# Patient Record
Sex: Female | Born: 1970 | Race: White | Hispanic: No | Marital: Married | Smoking: Never smoker
Health system: Southern US, Community
[De-identification: ages and names within clinical notes are randomized; demographics above are authoritative.]

## PROBLEM LIST (undated history)

## (undated) DIAGNOSIS — M5432 Sciatica, left side: Secondary | ICD-10-CM

## (undated) DIAGNOSIS — R002 Palpitations: Secondary | ICD-10-CM

## (undated) HISTORY — DX: Sciatica, left side: M54.32

## (undated) HISTORY — DX: Palpitations: R00.2

---

## 2014-02-17 DIAGNOSIS — R252 Cramp and spasm: Secondary | ICD-10-CM | POA: Insufficient documentation

## 2015-12-14 DIAGNOSIS — R490 Dysphonia: Secondary | ICD-10-CM | POA: Insufficient documentation

## 2015-12-14 DIAGNOSIS — K219 Gastro-esophageal reflux disease without esophagitis: Secondary | ICD-10-CM | POA: Insufficient documentation

## 2015-12-14 DIAGNOSIS — R05 Cough: Secondary | ICD-10-CM | POA: Insufficient documentation

## 2015-12-14 DIAGNOSIS — J3089 Other allergic rhinitis: Secondary | ICD-10-CM | POA: Insufficient documentation

## 2015-12-14 DIAGNOSIS — R053 Chronic cough: Secondary | ICD-10-CM | POA: Insufficient documentation

## 2017-02-07 ENCOUNTER — Other Ambulatory Visit: Payer: Self-pay | Admitting: Family Medicine

## 2017-02-07 ENCOUNTER — Ambulatory Visit (HOSPITAL_BASED_OUTPATIENT_CLINIC_OR_DEPARTMENT_OTHER)
Admission: RE | Admit: 2017-02-07 | Discharge: 2017-02-07 | Disposition: A | Discharge status: Home / Self Care | Payer: Managed Care, Other (non HMO) | Source: Ambulatory Visit | Attending: Family Medicine | Admitting: Family Medicine

## 2017-02-07 DIAGNOSIS — M1288 Other specific arthropathies, not elsewhere classified, other specified site: Secondary | ICD-10-CM | POA: Diagnosis not present

## 2017-02-07 DIAGNOSIS — M545 Low back pain, unspecified: Secondary | ICD-10-CM

## 2017-10-24 ENCOUNTER — Ambulatory Visit (INDEPENDENT_AMBULATORY_CARE_PROVIDER_SITE_OTHER)
Admission: RE | Admit: 2017-10-24 | Discharge: 2017-10-24 | Disposition: A | Discharge status: Home / Self Care | Payer: Managed Care, Other (non HMO) | Source: Ambulatory Visit | Attending: Pulmonary Disease | Admitting: Pulmonary Disease

## 2017-10-24 ENCOUNTER — Encounter: Payer: Self-pay | Admitting: Pulmonary Disease

## 2017-10-24 ENCOUNTER — Ambulatory Visit (INDEPENDENT_AMBULATORY_CARE_PROVIDER_SITE_OTHER): Payer: Managed Care, Other (non HMO) | Admitting: Pulmonary Disease

## 2017-10-24 VITALS — BP 126/80 | HR 87 | Ht 66.0 in | Wt 194.0 lb

## 2017-10-24 DIAGNOSIS — R05 Cough: Secondary | ICD-10-CM

## 2017-10-24 DIAGNOSIS — K219 Gastro-esophageal reflux disease without esophagitis: Secondary | ICD-10-CM

## 2017-10-24 DIAGNOSIS — J45991 Cough variant asthma: Secondary | ICD-10-CM | POA: Diagnosis not present

## 2017-10-24 DIAGNOSIS — R053 Chronic cough: Secondary | ICD-10-CM

## 2017-10-24 NOTE — Assessment & Plan Note (Signed)
She has completed course of low-dose Protonix without significant relief.  EGD is planned

## 2017-10-24 NOTE — Patient Instructions (Addendum)
Spirometry shows normal lung function Chest x-ray today  We will treat you for postnasal drip as a trigger for your cough  Trial of chlorpheniramine 8 mg at bedtime x 3 weeks Store brand Sudafed such as phenylephrine 10 mg daytime x 3 weeks  Sample of Symbicort 80 2 puffs twice daily, rinse mouth after use

## 2017-10-24 NOTE — Assessment & Plan Note (Signed)
Spirometry shows normal lung function Chest x-ray today  We will treat sequentially for postnasal drip as a trigger   Trial of chlorpheniramine 8 mg at bedtime x 3 weeks Store brand Sudafed such as phenylephrine 10 mg daytime x 3 weeks   If no better, trial of  Sample of Symbicort 80 2 puffs twice daily, rinse mouth after use

## 2017-10-24 NOTE — Progress Notes (Signed)
Subjective:    Patient ID: Sandra Velasquez, female    DOB: 08-Jun-1970, 47 y.o.   MRN: [id number]  HPI  Chief Complaint  Patient presents with  . Pulm Consult    Self-Referral for chronic, productive cough for the past few years.    47 year old never smoker presents for evaluation of chronic cough and wheezing.  She reports ongoing symptoms for longer than 5 years.  Symptoms are worse with laughing and with exercise and seems to be relieved by resting.  She has tried Zyrtec, Allegra over-the-counter cough syrup and Singulair with minimal relief.  She reports onset of symptoms around 2011 when she first underwent ENT evaluation in [facility], upper endoscopy showed nodules in her throat.  She then migrated to the [place] for 3 years and symptoms improved.  She moved to [place] around 2015 and she developed constant throat clearing and coughing and wheezing.  She was evaluated by 3 ENT physicians at different times, Dr. Jearld Fenton told her that she has chronic sinusitis and advised FESS procedure.  Another ENT physician advised an allergy test which was unrevealing.  Dr. Pollyann Kennedy told her that symptoms could be related to GERD.  She was given low-dose Protonix by her PCP without significant relief.  She has seen a gastroenterologist and EGD spine on 10/30. She also developed low-grade fevers and underwent evaluation by PCP, she was found to have low B12 and vitamin D levels and iron levels which were repleted.  She reports a history of exercise-induced asthma when in high school which she grew out off and never needed an inhaler on a consistent basis. She denies heartburn or frequent chest colds.  She reports occasional nasal congestion and drainage  Significant tests/ events reviewed  Spirometry today shows no evidence of airway obstruction with FEV1 96%. CT sinuses 12/2015 mild mucosal thickening of the right sphenoid no other active sinusitis   She is a homemaker, lives with her husband and  sons and has a goldendoodle at home for the last 5 years.  No past medical history on file.   Past surgical history-tubal ligation and C-section x3  Social History   Socioeconomic History  . Marital status: Married    Spouse name: Not on file  . Number of children: Not on file  . Years of education: Not on file  . Highest education level: Not on file  Occupational History  . Not on file  Social Needs  . Financial resource strain: Not on file  . Food insecurity:    Worry: Not on file    Inability: Not on file  . Transportation needs:    Medical: Not on file    Non-medical: Not on file  Tobacco Use  . Smoking status: Never Smoker  . Smokeless tobacco: Never Used  Substance and Sexual Activity  . Alcohol use: Not on file  . Drug use: Not on file  . Sexual activity: Not on file  Lifestyle  . Physical activity:    Days per week: Not on file    Minutes per session: Not on file  . Stress: Not on file  Relationships  . Social connections:    Talks on phone: Not on file    Gets together: Not on file    Attends religious service: Not on file    Active member of club or organization: Not on file    Attends meetings of clubs or organizations: Not on file    Relationship status: Not on file  .  Intimate partner violence:    Fear of current or ex partner: Not on file    Emotionally abused: Not on file    Physically abused: Not on file    Forced sexual activity: Not on file  Other Topics Concern  . Not on file  Social History Narrative  . Not on file    No family history on file. Father had lymphoma and rheumatoid arthritis and heart disease  Review of Systems  Constitutional: Negative for fever and unexpected weight change.  HENT: Positive for congestion. Negative for dental problem, ear pain, nosebleeds, postnasal drip, rhinorrhea, sinus pressure, sneezing, sore throat and trouble swallowing.   Eyes: Negative for redness and itching.  Respiratory: Positive for cough  and shortness of breath. Negative for chest tightness and wheezing.   Cardiovascular: Negative for palpitations and leg swelling.  Gastrointestinal: Negative for nausea and vomiting.  Genitourinary: Negative for dysuria.  Musculoskeletal: Negative for joint swelling.  Skin: Negative for rash.  Allergic/Immunologic: Negative.  Negative for environmental allergies, food allergies and immunocompromised state.  Neurological: Negative for headaches.  Hematological: Does not bruise/bleed easily.  Psychiatric/Behavioral: Negative for dysphoric mood. The patient is not nervous/anxious.        Objective:   Physical Exam  Gen. Pleasant,  in no distress, normal affect ENT - no lesions, no post nasal drip, class 2 airway, no pallor Neck: No JVD, no thyromegaly, no carotid bruits Lungs: no use of accessory muscles, no dullness to percussion, decreased without rales or rhonchi , wheezing after a bout of coughing Cardiovascular: Rhythm regular, heart sounds  normal, no murmurs or gallops, no peripheral edema Abdomen: soft and non-tender, no hepatosplenomegaly, BS normal. Musculoskeletal: No deformities, no cyanosis or clubbing Neuro:  alert, non focal, no tremors       Assessment & Plan:

## 2017-12-09 ENCOUNTER — Ambulatory Visit: Payer: Managed Care, Other (non HMO) | Admitting: Pulmonary Disease

## 2018-06-02 ENCOUNTER — Other Ambulatory Visit: Payer: Self-pay

## 2018-06-02 ENCOUNTER — Ambulatory Visit (INDEPENDENT_AMBULATORY_CARE_PROVIDER_SITE_OTHER): Payer: Managed Care, Other (non HMO) | Admitting: Allergy and Immunology

## 2018-06-02 ENCOUNTER — Encounter: Payer: Self-pay | Admitting: Allergy and Immunology

## 2018-06-02 VITALS — BP 120/70 | HR 75 | Temp 98.5°F | Resp 16 | Ht 65.0 in | Wt 184.6 lb

## 2018-06-02 DIAGNOSIS — J3089 Other allergic rhinitis: Secondary | ICD-10-CM | POA: Diagnosis not present

## 2018-06-02 DIAGNOSIS — J301 Allergic rhinitis due to pollen: Secondary | ICD-10-CM | POA: Diagnosis not present

## 2018-06-02 DIAGNOSIS — G43909 Migraine, unspecified, not intractable, without status migrainosus: Secondary | ICD-10-CM | POA: Diagnosis not present

## 2018-06-02 DIAGNOSIS — K219 Gastro-esophageal reflux disease without esophagitis: Secondary | ICD-10-CM

## 2018-06-02 DIAGNOSIS — L710 Perioral dermatitis: Secondary | ICD-10-CM

## 2018-06-02 MED ORDER — AZELASTINE HCL 0.1 % NA SOLN: 2.0000 | Freq: Two times a day (BID) | NASAL | 5 refills | Status: DC

## 2018-06-02 MED ORDER — OMEPRAZOLE 40 MG PO CPDR: 40.0000 mg | DELAYED_RELEASE_CAPSULE | Freq: Every day | ORAL | 5 refills | Status: DC

## 2018-06-02 MED ORDER — CYPROHEPTADINE HCL 4 MG PO TABS: 4.0000 mg | ORAL_TABLET | Freq: Three times a day (TID) | ORAL | 0 refills | Status: DC | PRN

## 2018-06-02 MED ORDER — FAMOTIDINE 40 MG PO TABS: 40.0000 mg | ORAL_TABLET | Freq: Every day | ORAL | 5 refills | Status: DC

## 2018-06-02 MED ORDER — METRONIDAZOLE 1 % EX GEL: Freq: Every day | CUTANEOUS | 0 refills | Status: DC

## 2018-06-02 NOTE — Patient Instructions (Addendum)
  1.  Allergen avoidance measures  2.  Treat and prevent inflammation:   A.  OTC Rhinocort-1 spray each nostril daily  B.  Metronidazole 1% cream- apply to face daily  3.  Treat and prevent LPR:   A.  Consolidate caffeine use to AM  B.  Omeprazole 40 mg -1 tablet in a.m.  C.  Famotidine 40 mg - 1 tablet in p.m.  4.  Treat and prevent headaches:   A.  Consolidate caffeine use to AM  B.  Periactin 4 mg -1/2 tablet at bedtime  5.  If needed:   A. Azelastine -1-2 sprays each nostril 1-2 times a day  B.  OTC Pataday -1 drop each eye 1 time per day  6.  Immunotherapy?  7.  Return to clinic in 4 weeks or earlier if problem

## 2018-06-02 NOTE — Progress Notes (Signed)
[place] [facility] [place] -    Dear Dr. Conley Rolls,  Thank you for referring Sandra Velasquez to the [facility] on 06/02/2018.   Below is a summation of this patient's evaluation and recommendations.  Thank you for your referral. I will keep you informed about this patient's response to treatment.   If you have any questions please do not hesitate to contact me.   Sincerely,  Jessica Priest, MD Allergy / Immunology [facility]   ______________________________________________________________________    NEW PATIENT NOTE  Referring Provider: Lenell Antu, DO Primary Provider: Lenell Antu, DO Date of office visit: 06/02/2018    Subjective:   Chief Complaint:  Sandra Velasquez (DOB: Jun 09, 1970) is a 48 y.o. female who presents to the clinic on 06/02/2018 with a chief complaint of Allergic Rhinitis  and Urticaria (fever blister ) .     HPI: Sandra Velasquez presents to this clinic in evaluation of multiple issues.  First, she has a history of rhinitis with nasal congestion and sneezing and stuffiness and some itchy watery eyes that appears to occur on a perennial basis and flares when being exposed to pollen during the seasons of the year.  This has been a much more active issue since she moved from [place] in 2015 to the [place] area.  She uses a nasal steroid and Singulair and antihistamines but she is not very impressed that these medications really result in good control of her issue.  Antihistamines just make her lips too dry.  Second, because of her nasal issues she has had evaluation with ENT and apparently was told that she had chronic sinusitis based upon a CT scan and it was recommended that she undergo surgery.  However, she sought out a second opinion in 2018 with ENT in [place] who performed a repeat CT scan which apparently did not identify a significant amount  of sinusitis and she was skin tested at that point in time and found to be allergic to various aeroallergens.  Third, she has constant throat clearing and postnasal drip and cannot clear out her throat and has coughing.  Her husband states that she is driving him crazy with all of her coughing.  She apparently was evaluated for reflux with an upper endoscopy which did not identify any reflux.  She did drink lots of [employer] currently at 2 cans of [employer] per day.  In 2019 she empirically treated her reflux by discontinuing her [facility] for 4 months which did not really help this issue.  She may been given a proton pump inhibitor which did not help this issue.  Fourth, she has a history of "recurrent fever.  She has a tympanic thermometer which she measures whenever she develops a headache.  She states that her temperature ranges from about 99-99.8 when she is having a headache.  Apparently she has seen a rheumatologist and a hematologist regarding this issue back in 2018 of which the diagnostic evaluation was inconclusive for an obvious disease giving rise to this issue.  Fifth, she has headaches.  She has headaches located in her frontal region that are pounding and occur about every other day for which she must take Advil and lay down for about an hour or so.  There is no associated scotoma or dizziness or other neurological symptoms.  There is no obvious trigger giving rise to this issue.  She  believes that her sleep is pretty good.  If she is not interrupted at nighttime she sleeps throughout the night.  Sometimes her dog wakes her up at nighttime.  Sixth, she has "itchy bumps" that come up on her face usually affecting her cheeks and jawline that are intensely itchy and she will excoriate these and then they develop a little bit of a scar.  This is been a common issue for the past 3 years.  She has tried triamcinolone which has not helped her.  Antihistamines has not helped her.  She now  covers these up with make-up.  History reviewed. No pertinent past medical history.  Past Surgical History:  Procedure Laterality Date  . CESAREAN SECTION     2001, 2003, 2007    Allergies as of 06/02/2018      Reactions   Clarithromycin Other (See Comments)   Cyproheptadine    Other reaction(s): Other   Amoxicillin-pot Clavulanate Other (See Comments)   Other   Erythromycin    Abdominal pain   Penicillin G Other (See Comments)   Other      Medication List    Calcium 1200+D3 600-40-500 MG-MG-UNIT Tb24 Generic drug:  Calcium-Magnesium-Vitamin D Take 1 tablet by mouth daily.   Cryselle-28 0.3-30 MG-MCG tablet Generic drug:  norgestrel-ethinyl estradiol Take 1 tablet by mouth daily.   IRON-FOLIC ACID PO Take 65 mg by mouth.   vitamin B-12 500 MCG tablet Commonly known as:  CYANOCOBALAMIN Take 2,500 mcg by mouth daily.       Review of systems negative except as noted in HPI / PMHx or noted below:  Review of Systems  Constitutional: Negative.   HENT: Negative.   Eyes: Negative.   Respiratory: Negative.   Cardiovascular: Negative.   Gastrointestinal: Negative.   Genitourinary: Negative.   Musculoskeletal: Negative.   Skin: Negative.   Neurological: Negative.   Endo/Heme/Allergies: Negative.   Psychiatric/Behavioral: Negative.     History reviewed. No pertinent family history.  Social History   Socioeconomic History  . Marital status: Married    Spouse name: Not on file  . Number of children: Not on file  . Years of education: Not on file  . Highest education level: Not on file  Occupational History  . Not on file  Social Needs  . Financial resource strain: Not on file  . Food insecurity:    Worry: Not on file    Inability: Not on file  . Transportation needs:    Medical: Not on file    Non-medical: Not on file  Tobacco Use  . Smoking status: Never Smoker  . Smokeless tobacco: Never Used  Substance and Sexual Activity  . Alcohol use: Never     Frequency: Never  . Drug use: Never  . Sexual activity: Never  Lifestyle  . Physical activity:    Days per week: Not on file    Minutes per session: Not on file  . Stress: Not on file  Relationships  . Social connections:    Talks on phone: Not on file    Gets together: Not on file    Attends religious service: Not on file    Active member of club or organization: Not on file    Attends meetings of clubs or organizations: Not on file    Relationship status: Not on file  . Intimate partner violence:    Fear of current or ex partner: Not on file    Emotionally abused: Not on file  Physically abused: Not on file    Forced sexual activity: Not on file  Other Topics Concern  . Not on file  Social History Narrative  . Not on file    Environmental and Social history  Lives in a house with a dry environment, a dog located inside the household, carpet in the bedroom, plastic on the bed, plastic on the pillow, no smoking ongoing with inside the household.  Objective:   Vitals:   06/02/18 0920  BP: 120/70  Pulse: 75  Resp: 16  Temp: 98.5 F (36.9 C)  SpO2: 97%   Height:  (165.1 cm) Weight: 184 lb 9.6 oz (83.7 kg)  Physical Exam Constitutional:      Appearance: She is not diaphoretic.  HENT:     Head: Normocephalic. No right periorbital erythema or left periorbital erythema.     Right Ear: Tympanic membrane, ear canal and external ear normal.     Left Ear: Tympanic membrane, ear canal and external ear normal.     Nose: Nose normal. No mucosal edema or rhinorrhea.     Mouth/Throat:     Pharynx: Uvula midline. No oropharyngeal exudate.  Eyes:     General: Lids are normal.     Conjunctiva/sclera: Conjunctivae normal.     Pupils: Pupils are equal, round, and reactive to light.  Neck:     Thyroid: No thyromegaly.     Trachea: Trachea normal. No tracheal tenderness or tracheal deviation.  Cardiovascular:     Rate and Rhythm: Normal rate and regular rhythm.      Heart sounds: Normal heart sounds, S1 normal and S2 normal. No murmur.  Pulmonary:     Effort: Pulmonary effort is normal. No respiratory distress.     Breath sounds: Normal breath sounds. No stridor. No wheezing or rales.  Chest:     Chest wall: No tenderness.  Abdominal:     General: There is no distension.     Palpations: Abdomen is soft. There is no mass.     Tenderness: There is no abdominal tenderness. There is no guarding or rebound.  Musculoskeletal:        General: No tenderness.  Lymphadenopathy:     Head:     Right side of head: No tonsillar adenopathy.     Left side of head: No tonsillar adenopathy.     Cervical: No cervical adenopathy.  Skin:    Coloration: Skin is not pale.     Findings: Rash (Multiple punctate intermittently erythematous papules involving chin and perioral region) present. No erythema.     Nails: There is no clubbing.   Neurological:     Mental Status: She is alert.     Diagnostics: Allergy skin tests were performed.  She demonstrated hypersensitivity to house dust mite and Alternaria mold.  Spirometry was performed.  Her FEV1 was 3.29 at 112% of predicted with a FEV1 ratio of 0.81.  Results of a chest x-ray obtained 24 October 2017 identified the following:  The heart size and mediastinal contours are within normal limits. Both lungs are clear. The visualized skeletal structures are Unremarkable.  Results of a sinus CT scan obtained 28 November 2017 identified the following:  Paranasal sinuses: Frontal: Normally aerated. Patent frontal sinus drainage pathways. Ethmoid: Normally aerated. Maxillary: Mild mucosal edema base of right maxillary sinus unchanged. Mild mucosal edema and small air-fluid level in the left maxillary sinus is new Sphenoid: Improved mucosal edema in the sphenoid sinus. Right ostiomeatal unit: Mildly narrowed due to  mucosal edema Left ostiomeatal unit: Patent. Nasal passages: Patent. Mild septal deviation to the  right Anatomy: Large concha bullosa left middle turbinate unchanged. No acute bony abnormality.  Results of a sinus CT scan obtained 26 November 2015 identified the following:  Mild mucosal thickening in the right sphenoid sinus with probable retained secretions. The sphenoid sinus ostia are widely patent. Widely patent bilateral maxillary infundibula with minor mucosal thickening on the floor of the right maxillary sinus. No associated dental disease noted. Clear frontal ethmoidal recesses. Clear ethmoids. Concha bullosa on the left causing relative left nasal cavity stenosis. Essentially midline nasal septum.  Results of blood tests obtained 01 May 2016 identified ferritin 7 NG/mL, hemoglobin 11.9, WBC 5.7 absolute lymphocyte 2100, platelet 352,, EBV VCA IgG 50 6.2U/mL, RA screen negative, ANA negative, CMV IgG negative, HIV antigen/antibody combo negative  Assessment and Plan:    1. Perennial allergic rhinitis   2. Seasonal allergic rhinitis due to pollen   3. Migraine syndrome   4. LPRD (laryngopharyngeal reflux disease)   5. Perioral dermatitis     1.  Allergen avoidance measures  2.  Treat and prevent inflammation:   A.  OTC Rhinocort-1 spray each nostril daily  B.  Metronidazole 1% cream- apply to face daily  3.  Treat and prevent LPR:   A.  Consolidate caffeine use to AM  B.  Omeprazole 40 mg -1 tablet in a.m.  C.  Famotidine 40 mg - 1 tablet in p.m.  4.  Treat and prevent headaches:   A.  Consolidate caffeine use to AM  B.  Periactin 4 mg -1/2 tablet at bedtime  5.  If needed:   A. Azelastine -1-2 sprays each nostril 1-2 times a day  B.  OTC Pataday -1 drop each eye 1 time per day  6.  Immunotherapy?  7.  Return to clinic in 4 weeks or earlier if problem  Cardosa does appear to have atopic disease and we will get her to perform allergen avoidance measures and use anti-inflammatory agents for her airway as noted above.  She also appears to have a  history very consistent with LPR and she will utilize an aggressive plan directed against this problem as noted above.  She also appears to have headaches every other day which is probably tied up with her caffeine use and a form of migraine and we will have her consolidate her caffeine and start Periactin at bedtime.  She also appears to have a perioral dermatitis which we will treat with topical metronidazole.  I will regroup with her in 4 weeks or earlier to assess her response and consider further evaluation and treatment based upon this response.  Jessica Priest, MD Allergy / Immunology [facility] [place]

## 2018-06-03 ENCOUNTER — Encounter: Payer: Self-pay | Admitting: Allergy and Immunology

## 2018-07-07 ENCOUNTER — Ambulatory Visit (INDEPENDENT_AMBULATORY_CARE_PROVIDER_SITE_OTHER): Payer: Managed Care, Other (non HMO) | Admitting: Allergy and Immunology

## 2018-07-07 ENCOUNTER — Other Ambulatory Visit: Payer: Self-pay

## 2018-07-07 VITALS — BP 100/72 | HR 80 | Resp 18

## 2018-07-07 DIAGNOSIS — G43909 Migraine, unspecified, not intractable, without status migrainosus: Secondary | ICD-10-CM | POA: Diagnosis not present

## 2018-07-07 DIAGNOSIS — J3089 Other allergic rhinitis: Secondary | ICD-10-CM

## 2018-07-07 DIAGNOSIS — L710 Perioral dermatitis: Secondary | ICD-10-CM

## 2018-07-07 DIAGNOSIS — K219 Gastro-esophageal reflux disease without esophagitis: Secondary | ICD-10-CM | POA: Diagnosis not present

## 2018-07-07 NOTE — Progress Notes (Signed)
[place]   Follow-up Note  Referring Provider: Lenell AntuLe, Thao P, DO Primary Provider: Jerral RalphMyers, Stephanie J, MD Date of Office Visit: 07/07/2018  Subjective:   Sandra Velasquez (DOB: [birth year]) is a 48 y.o. female who [facility] in re-evaluation of the following:  HPI: Sandra Velasquez returns to this clinic in reevaluation of allergic rhinoconjunctivitis, suspected LPR, chronic headache, and perioral dermatitis.  These issues were addressed during her initial evaluation of 02 Jun 2018.  She has performed allergen avoidance measures directed against house dust mite and she is better regarding her nasal congestion and sneezing and stuffiness.  Her eyes are still occasionally irritated and she used Pataday with good success but apparently after using Pataday for a week or so her lips became very dry.  She is not using a nasal steroid at this point time for she feels as though she does not have a need to do so.  She still continues to have throat clearing and postnasal drip.  She continues to use therapy directed against reflux but she has not really noticed a significant change.  She has consolidated her caffeine about 25% by moving most of her caffeine consumption to the morning.  Her headaches are much better since she moving her caffeine consumption to just in the morning.  She has only had 2 headaches.  She has not been using her Periactin on a consistent basis.  She still continues to have the rash around her face.  She is only using metronidazole as needed which is relatively rare.  Allergies as of 07/07/2018      Reactions   Clarithromycin Other (See Comments)   Cyproheptadine    Other reaction(s): Other   Amoxicillin-pot Clavulanate Other (See Comments)   Other   Erythromycin    Abdominal pain   Penicillin G Other (See Comments)   Other      Medication List      azelastine 0.1 % nasal spray  Commonly known as: ASTELIN Place 2 sprays into both nostrils 2 (two) times daily. Use in each nostril as directed   Calcium 1200+D3 600-40-500 MG-MG-UNIT Tb24 Generic drug: Calcium-Magnesium-Vitamin D Take 1 tablet by mouth daily.   Cryselle-28 0.3-30 MG-MCG tablet Generic drug: norgestrel-ethinyl estradiol Take 1 tablet by mouth daily.   famotidine 40 MG tablet Commonly known as: PEPCID Take 1 tablet (40 mg total) by mouth at bedtime.   IRON-FOLIC ACID PO Take 65 mg by mouth.   metroNIDAZOLE 1 % gel Commonly known as: METROGEL Apply topically daily.   omeprazole 40 MG capsule Commonly known as: PRILOSEC Take 1 capsule (40 mg total) by mouth daily.   vitamin B-12 500 MCG tablet Commonly known as: CYANOCOBALAMIN Take 2,500 mcg by mouth daily.       No past medical history on file.  Past Surgical History:  Procedure Laterality Date  . CESAREAN SECTION     2001, 2003, 2007    Review of systems negative except as noted in HPI / PMHx or noted below:  Review of Systems  Constitutional: Negative.   HENT: Negative.   Eyes: Negative.   Respiratory: Negative.   Cardiovascular: Negative.   Gastrointestinal: Negative.   Genitourinary: Negative.   Musculoskeletal: Negative.   Skin: Negative.   Neurological: Negative.   Endo/Heme/Allergies: Negative.   Psychiatric/Behavioral: Negative.      Objective:   Vitals:   07/07/18 1007  BP: 100/72  Pulse: 80  Resp: 18  SpO2: 97%          Physical Exam Constitutional:      Appearance: She is not diaphoretic.  HENT:     Head: Normocephalic.     Right Ear: Tympanic membrane, ear canal and external ear normal.     Left Ear: Tympanic membrane, ear canal and external ear normal.     Nose: Nose normal. No mucosal edema or rhinorrhea.     Mouth/Throat:     Pharynx: Uvula midline. No oropharyngeal exudate.  Eyes:     Conjunctiva/sclera: Conjunctivae normal.  Neck:     Thyroid: No thyromegaly.     Trachea: Trachea  normal. No tracheal tenderness or tracheal deviation.  Cardiovascular:     Rate and Rhythm: Normal rate and regular rhythm.     Heart sounds: Normal heart sounds, S1 normal and S2 normal. No murmur.  Pulmonary:     Effort: No respiratory distress.     Breath sounds: Normal breath sounds. No stridor. No wheezing or rales.  Lymphadenopathy:     Head:     Right side of head: No tonsillar adenopathy.     Left side of head: No tonsillar adenopathy.     Cervical: No cervical adenopathy.  Skin:    Findings: Rash (Perioral dermatitis with acneiform-like lesions affecting perioral region) present. No erythema.     Nails: There is no clubbing.   Neurological:     Mental Status: She is alert.     Diagnostics: none  Assessment and Plan:   1. Perennial allergic rhinitis   2. Migraine syndrome   3. LPRD (laryngopharyngeal reflux disease)   4. Perioral dermatitis     1.  Continue to perform allergen avoidance measures  2.  Continue to treat and prevent inflammation:   A.  OTC Rhinocort-1 spray each nostril 3-7 times per week  B.  Metronidazole 1% cream- apply to face daily  3.  Continue to treat and prevent LPR:   A.  Consolidate caffeine use to AM  B.  Discontinue omeprazole and famotidine  4.  Continue to treat and prevent headaches:   A.  Consolidate caffeine use to AM  B.  Do not restart periactin at this point  5.  If needed:   A. Azelastine -1-2 sprays each nostril 1-2 times a day  B.  OTC Pataday -1 drop each eye 1 time per day  6.  Consider starting a course of immunotherapy  7.  Return to clinic in 6 months or earlier if problem  8.  Obtain full flu vaccine  Celita is somewhat better with attention to allergen avoidance measures but she still remains with respiratory tract symptoms and she would definitely be a candidate for immunotherapy.  I have encouraged her to use some dose of nasal steroid on a regular basis and I have also encouraged her to use topical  metronidazole on a regular basis to address the inflammatory component of her skin condition.  Her headaches are much better since she has reorganized her caffeine use and I do not think there is a need to restart any Periactin at this point.  She has really noticed no improvement regarding her drainage in her throat and throat clearing and slight cough with attention to reflux therapy and were now going to have her perform the "reverse experiment" by discontinuing omeprazole and famotidine and seeing what happens to the symptoms as she moves forward.  I will see her back in this clinic in  6 months or earlier if there is a problem.  Allena Katz, MD Allergy / Immunology [place]

## 2018-07-07 NOTE — Patient Instructions (Addendum)
  1.  Continue to perform allergen avoidance measures  2.  Continue to treat and prevent inflammation:   A.  OTC Rhinocort-1 spray each nostril 3-7 times per week  B.  Metronidazole 1% cream- apply to face daily  3.  Continue to treat and prevent LPR:   A.  Consolidate caffeine use to AM  B.  Discontinue omeprazole and famotidine  4.  Continue to treat and prevent headaches:   A.  Consolidate caffeine use to AM  B.  Do not restart periactin at this point  5.  If needed:   A. Azelastine -1-2 sprays each nostril 1-2 times a day  B.  OTC Pataday -1 drop each eye 1 time per day  6.  Consider starting a course of immunotherapy  7.  Return to clinic in 6 months or earlier if problem  8.  Obtain full flu vaccine

## 2018-07-08 ENCOUNTER — Encounter: Payer: Self-pay | Admitting: Allergy and Immunology

## 2018-09-08 ENCOUNTER — Other Ambulatory Visit: Payer: Self-pay | Admitting: Obstetrics and Gynecology

## 2018-09-08 DIAGNOSIS — Z9189 Other specified personal risk factors, not elsewhere classified: Secondary | ICD-10-CM

## 2018-11-06 ENCOUNTER — Ambulatory Visit
Admission: RE | Admit: 2018-11-06 | Discharge: 2018-11-06 | Disposition: A | Discharge status: Home / Self Care | Payer: Managed Care, Other (non HMO) | Source: Ambulatory Visit | Attending: Obstetrics and Gynecology | Admitting: Obstetrics and Gynecology

## 2018-11-06 DIAGNOSIS — Z9189 Other specified personal risk factors, not elsewhere classified: Secondary | ICD-10-CM

## 2018-11-06 MED ORDER — GADOBUTROL 1 MMOL/ML IV SOLN
8.0000 mL | Freq: Once | INTRAVENOUS | Status: AC | PRN
  Administered 2018-11-06: 8 mL via INTRAVENOUS

## 2019-01-12 ENCOUNTER — Ambulatory Visit: Payer: Managed Care, Other (non HMO) | Admitting: Allergy and Immunology

## 2019-02-18 ENCOUNTER — Other Ambulatory Visit: Payer: Self-pay

## 2019-02-18 ENCOUNTER — Encounter (HOSPITAL_COMMUNITY): Payer: Self-pay

## 2019-02-18 ENCOUNTER — Emergency Department (HOSPITAL_COMMUNITY)
Admission: EM | Admit: 2019-02-18 | Discharge: 2019-02-18 | Disposition: A | Discharge status: Home / Self Care | Payer: Managed Care, Other (non HMO) | Attending: Emergency Medicine | Admitting: Emergency Medicine

## 2019-02-18 DIAGNOSIS — T7840XA Allergy, unspecified, initial encounter: Secondary | ICD-10-CM | POA: Insufficient documentation

## 2019-02-18 DIAGNOSIS — Z79899 Other long term (current) drug therapy: Secondary | ICD-10-CM | POA: Insufficient documentation

## 2019-02-18 MED ORDER — EPINEPHRINE 0.3 MG/0.3ML IJ SOAJ: 0.3000 mg | INTRAMUSCULAR | 0 refills | Status: DC | PRN

## 2019-02-18 MED ORDER — FAMOTIDINE IN NACL 20-0.9 MG/50ML-% IV SOLN
20.0000 mg | Freq: Once | INTRAVENOUS | Status: AC
  Administered 2019-02-18: 20 mg via INTRAVENOUS
  Filled 2019-02-18: qty 50

## 2019-02-18 MED ORDER — PREDNISONE 10 MG PO TABS: 40.0000 mg | ORAL_TABLET | Freq: Every day | ORAL | 0 refills | Status: AC

## 2019-02-18 NOTE — Discharge Instructions (Addendum)
If you need to use your EpiPen, call 911 afterwards. Use your EpiPen if you start to feel like your lips, tongue or swelling or you are having trouble breathing. Follow-up with your allergy specialist. Return to the ED if this happens again. Do not use the hair product or any new hair products until you are evaluated by your allergy specialist.

## 2019-02-18 NOTE — ED Provider Notes (Signed)
[facility]-EMERGENCY DEPT Provider Note   CSN: [id number] Arrival date & time: 02/18/19  1453     History Chief Complaint  Patient presents with  . Allergic Reaction    Sandra Velasquez is a 49 y.o. female with a past medical history of allergic rhinitis presenting to the ED after allergic reaction that occurred prior to arrival.  She was getting a perm at a hair salon when her hairdresser put some sort of performing serum on her hair.  She immediately started developing burning and itching around her eyes, followed by itching around her mouth, palms and ankles, feeling like her lips and tongue was swelling.  She took a Claritin and was evaluated by EMS.  EMS gave her epi, Benadryl and Solu-Medrol.  Her symptoms have since subsided and almost completely resolved.  She denies history of anaphylactic reaction in the past.  She was told that she was allergic to environmental triggers such as wheat, dust mites and certain trees.  She was evaluated by an allergy specialist several months ago and an allergy skin test was done.  She cannot recall any other trigger other than the serum used on her hair, and does not believe she is ever used the serum before.  Denies any current chest tightness, shortness of breath, tongue swelling, rash.  HPI     History reviewed. No pertinent past medical history.  Patient Active Problem List   Diagnosis Date Noted  . Chronic cough 12/14/2015  . Laryngopharyngeal reflux (LPR) 12/14/2015  . Perennial allergic rhinitis 12/14/2015  . Hoarseness 12/14/2015  . Muscle cramping 02/17/2014    Past Surgical History:  Procedure Laterality Date  . CESAREAN SECTION     2001, 2003, 2007     OB History   No obstetric history on file.     History reviewed. No pertinent family history.  Social History   Tobacco Use  . Smoking status: Never Smoker  . Smokeless tobacco: Never Used  Substance Use Topics  . Alcohol use: Never  . Drug use:  Never    Home Medications Prior to Admission medications   Medication Sig Start Date End Date Taking? Authorizing Provider  CRYSELLE-28 0.3-30 MG-MCG tablet Take 1 tablet by mouth daily. 08/25/17  Yes [provider]  Ferrous Sulfate (IRON PO) Take 1 tablet by mouth daily.   Yes [provider]  azelastine (ASTELIN) 0.1 % nasal spray Place 2 sprays into both nostrils 2 (two) times daily. Use in each nostril as directed Patient not taking: Reported on 02/18/2019 06/02/18   Kozlow, Alvira Philips, MD  cyproheptadine (PERIACTIN) 4 MG tablet Take 1 tablet (4 mg total) by mouth 3 (three) times daily as needed for allergies. Patient not taking: Reported on 02/18/2019 06/02/18   Jessica Priest, MD  EPINEPHrine 0.3 mg/0.3 mL IJ SOAJ injection Inject 0.3 mLs (0.3 mg total) into the muscle as needed for anaphylaxis. Call 911 after using your epi pen 02/18/19   Meiah Zamudio, PA-C  famotidine (PEPCID) 40 MG tablet Take 1 tablet (40 mg total) by mouth at bedtime. Patient not taking: Reported on 02/18/2019 06/02/18   Jessica Priest, MD  metroNIDAZOLE (METROGEL) 1 % gel Apply topically daily. Patient not taking: Reported on 02/18/2019 06/02/18   Jessica Priest, MD  omeprazole (PRILOSEC) 40 MG capsule Take 1 capsule (40 mg total) by mouth daily. Patient not taking: Reported on 02/18/2019 06/02/18   Jessica Priest, MD  predniSONE (DELTASONE) 10 MG tablet Take 4 tablets (  40 mg total) by mouth daily for 4 days. 02/18/19 02/22/19  Noya Santarelli, Nicanor Alcon, PA-C    Allergies    Clarithromycin, Cyproheptadine, Amoxicillin-pot clavulanate, Erythromycin, and Penicillin g  Review of Systems   Review of Systems  Constitutional: Negative for appetite change, chills and fever.  HENT: Positive for facial swelling (resolved). Negative for ear pain, rhinorrhea, sneezing and sore throat.   Eyes: Negative for photophobia and visual disturbance.  Respiratory: Positive for shortness of breath (resolved). Negative for cough, chest tightness and  wheezing.   Cardiovascular: Negative for chest pain and palpitations.  Gastrointestinal: Negative for abdominal pain, blood in stool, constipation, diarrhea, nausea and vomiting.  Genitourinary: Negative for dysuria, hematuria and urgency.  Musculoskeletal: Negative for myalgias.  Skin: Positive for rash (resolved).  Neurological: Negative for dizziness, weakness and light-headedness.    Physical Exam Updated Vital Signs BP (!) 102/59   Pulse 100   Temp 97.9 F (36.6 C) (Oral)   Resp 16   SpO2 95%   Physical Exam Vitals and nursing note reviewed.  Constitutional:      General: She is not in acute distress.    Appearance: She is well-developed.     Comments: No rash noted on exam.  No signs of angioedema or anaphylaxis.  Speaking complete sentences without difficulty.  HENT:     Head: Normocephalic and atraumatic.     Nose: Nose normal.  Eyes:     General: No scleral icterus.       Left eye: No discharge.     Conjunctiva/sclera: Conjunctivae normal.  Cardiovascular:     Rate and Rhythm: Normal rate and regular rhythm.     Heart sounds: Normal heart sounds. No murmur. No friction rub. No gallop.   Pulmonary:     Effort: Pulmonary effort is normal. No respiratory distress.     Breath sounds: Normal breath sounds.  Abdominal:     General: Bowel sounds are normal. There is no distension.     Palpations: Abdomen is soft.     Tenderness: There is no abdominal tenderness. There is no guarding.  Musculoskeletal:        General: Normal range of motion.     Cervical back: Normal range of motion and neck supple.  Skin:    General: Skin is warm and dry.     Findings: No rash.  Neurological:     Mental Status: She is alert.     Motor: No abnormal muscle tone.     Coordination: Coordination normal.     ED Results / Procedures / Treatments   Labs (all labs ordered are listed, but only abnormal results are displayed) Labs Reviewed - No data to display  EKG None  Radiology  No results found.  Procedures Procedures (including critical care time)  Medications Ordered in ED Medications  famotidine (PEPCID) IVPB 20 mg premix (0 mg Intravenous Stopped 02/18/19 1713)    ED Course  I have reviewed the triage vital signs and the nursing notes.  Pertinent labs & imaging results that were available during my care of the patient were reviewed by me and considered in my medical decision making (see chart for details).    MDM Rules/Calculators/A&P                      49 year old female with environmental allergies and allergic rhinitis presenting to the ED after anaphylactic reaction to a hair serum while at a hair salon.  She was given epinephrine, Benadryl and  Solu-Medrol in addition to the Claritin that she took after her reaction started.  Her symptoms have since resolved and she states that she is back to baseline.  Will give Pepcid and continue to monitor for any rebound symptoms.  5:54 PM Patient without recurrence of her angioedema or allergy symptoms.  She was able to get in touch with her hairdresser and will speak to her allergy specialist regarding ingredients of the hair serum.  In the meantime I have told her to refrain from any type of hair serum until she is evaluated by her allergy specialist.  We will give her a prescription for EpiPen, have her take a steroid burst and return for worsening symptoms.  She is comfortable with discharge home.  Patient is hemodynamically stable, in NAD, and able to ambulate in the ED. Evaluation does not show pathology that would require ongoing emergent intervention or inpatient treatment. I explained the diagnosis to the patient. Pain has been managed and has no complaints prior to discharge. Patient is comfortable with above plan and is stable for discharge at this time. All questions were answered prior to disposition. Strict return precautions for returning to the ED were discussed. Encouraged follow up with PCP.   An  After Visit Summary was printed and given to the patient.   Portions of this note were generated with Scientist, clinical (histocompatibility and immunogenetics). Dictation errors may occur despite best attempts at proofreading.  Final Clinical Impression(s) / ED Diagnoses Final diagnoses:  Allergic reaction, initial encounter    Rx / DC Orders ED Discharge Orders         Ordered    EPINEPHrine 0.3 mg/0.3 mL IJ SOAJ injection  As needed     02/18/19 1713    predniSONE (DELTASONE) 10 MG tablet  Daily     02/18/19 1713           Dietrich Pates, PA-C 02/18/19 1754    Charlynne Pander, MD 02/19/19 1504

## 2019-02-18 NOTE — ED Triage Notes (Signed)
EMS reports Pt coming from salon getting hair done, applied a hair product and Pt began to experience eyes burning and itching, then hand and feet itching and lips and tongue began to swell. Pt took a Claritin on site. Pt arrived and states swelling subsided, symptoms resolving, still has minor generalized itching.  BP 90/60  HR 92 RR 22 Sp02 99 RA CBG 105  20 LAC  50mg  Benedryl IV 0.3 Epi IM 125 Solumedrol IV Enroute.

## 2019-02-22 ENCOUNTER — Telehealth: Payer: Self-pay | Admitting: Allergy and Immunology

## 2019-02-22 NOTE — Telephone Encounter (Signed)
Patient went to the hair salon to get a permanent this past Thursday, 02/18/19. She said when the lady put the solution on her head, she broke out in a hot rash all over her body, her lips, eyes, tongue and throat all became swollen. The ambulance had to be called. Patient states she almost passed out. She said she had a reaction from the solution. She was able to have pictures sent to her of the box and all the ingredients. She has an appointment with Dr. Lucie Leather on 03/16/19. She wants to know if she needs to be seen sooner, or wait until 03/16/19 and bring in the pictures of the box of solution.

## 2019-02-22 NOTE — Telephone Encounter (Signed)
Please inform patient that as long as she is doing okay we can keep the March appointment and discuss this issue at that point.

## 2019-02-22 NOTE — Telephone Encounter (Signed)
Dr Kozlow please advise 

## 2019-02-22 NOTE — Telephone Encounter (Signed)
Called and advised to patient. Patient wanted to know if she wanted to know if she should e-mail pictures of the ingredients and reaction. Sent a link for patient to set up MyChart so that she can send information that way. Patient verbalized understanding.

## 2019-03-16 ENCOUNTER — Ambulatory Visit: Payer: Managed Care, Other (non HMO) | Admitting: Allergy and Immunology

## 2019-04-20 ENCOUNTER — Encounter: Payer: Self-pay | Admitting: Allergy and Immunology

## 2019-05-04 ENCOUNTER — Encounter: Payer: Self-pay | Admitting: Allergy and Immunology

## 2019-05-11 ENCOUNTER — Other Ambulatory Visit: Payer: Self-pay

## 2019-05-11 ENCOUNTER — Ambulatory Visit (INDEPENDENT_AMBULATORY_CARE_PROVIDER_SITE_OTHER): Payer: Managed Care, Other (non HMO) | Admitting: Allergy and Immunology

## 2019-05-11 VITALS — Temp 97.6°F

## 2019-05-11 DIAGNOSIS — K219 Gastro-esophageal reflux disease without esophagitis: Secondary | ICD-10-CM

## 2019-05-11 DIAGNOSIS — L710 Perioral dermatitis: Secondary | ICD-10-CM

## 2019-05-11 DIAGNOSIS — G43909 Migraine, unspecified, not intractable, without status migrainosus: Secondary | ICD-10-CM

## 2019-05-11 DIAGNOSIS — T782XXA Anaphylactic shock, unspecified, initial encounter: Secondary | ICD-10-CM

## 2019-05-11 DIAGNOSIS — J3089 Other allergic rhinitis: Secondary | ICD-10-CM

## 2019-05-11 MED ORDER — OLOPATADINE HCL 0.2 % OP SOLN: 1.0000 [drp] | Freq: Every day | OPHTHALMIC | 5 refills | Status: DC | PRN

## 2019-05-11 MED ORDER — DEXILANT 60 MG PO CPDR: 60.0000 mg | DELAYED_RELEASE_CAPSULE | Freq: Every day | ORAL | 5 refills | Status: DC

## 2019-05-11 MED ORDER — EPINEPHRINE 0.3 MG/0.3ML IJ SOAJ: 0.3000 mg | Freq: Once | INTRAMUSCULAR | 1 refills | Status: AC

## 2019-05-11 MED ORDER — METRONIDAZOLE 1 % EX GEL: Freq: Every day | CUTANEOUS | 0 refills | Status: DC

## 2019-05-11 NOTE — Progress Notes (Signed)
[place] - [place] - [place] - [place] - [place]   Follow-up Note  Referring Provider: Jerral Ralph, MD Primary Provider: Jerral Ralph, MD Date of Office Visit: 05/11/2019  Subjective:   Sandra Velasquez (DOB: 22-Jun-1970) is a 49 y.o. female who [facility] in re-evaluation of the following:  HPI: Fischetti returns to this clinic in reevaluation of allergic rhinoconjunctivitis, suspected LPR, chronic headache syndrome, perioral dermatitis, and a recent allergic reaction.  I have not seen her in this clinic since 10 July 2018.  Her acute allergic reaction occurred in February 2020.  She was having a body wave performed on her hair and within 15 minutes developed itchy eyes and then eye swelling and then global flushing and tongue swelling and throat swelling requiring transportation to the emergency room where she was injected with epinephrine.  Her reaction lasted approximately 1 hour.  She continues to have lots of throat clearing and postnasal drip.  She has not really noticed much change from this issue from when we saw her in the past.  It should be noted that she is not using any therapy for reflux as she went to see a gastroenterologist who told her she does not have reflux.  She has been having issues with her eyes. This spring she has been having itchy eyes and she has also been having itchy throat.  She has been taking some Zyrtec which has not helped.  She started some Pataday which has helped somewhat.  She is back to having chronic daily headache.  She is back to drinking 2 caffeinated drinks per day.  She is not using Periactin.  She still continues to have intermittent bumps on her facial skin.  She only uses metronidazole as needed.  As well, she states that sometimes her lips get really dry and irritated.  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  She has received her  first Covid vaccination.  Allergies as of 05/11/2019      Reactions   Clarithromycin Other (See Comments)   Cyproheptadine    Other reaction(s): Other   Amoxicillin-pot Clavulanate Other (See Comments)   Other   Erythromycin    Abdominal pain   Penicillin G Other (See Comments)   Other      Medication List      azelastine 0.1 % nasal spray Commonly known as: ASTELIN Place 2 sprays into both nostrils 2 (two) times daily. Use in each nostril as directed   Cryselle-28 0.3-30 MG-MCG tablet Generic drug: norgestrel-ethinyl estradiol Take 1 tablet by mouth daily.   cyproheptadine 4 MG tablet Commonly known as: PERIACTIN Take 1 tablet (4 mg total) by mouth 3 (three) times daily as needed for allergies.   EPINEPHrine 0.3 mg/0.3 mL Soaj injection Commonly known as: EPI-PEN Inject 0.3 mLs (0.3 mg total) into the muscle as needed for anaphylaxis. Call 911 after using your epi pen   famotidine 40 MG tablet Commonly known as: PEPCID Take 1 tablet (40 mg total) by mouth at bedtime.   IRON PO Take 1 tablet by mouth daily.   metroNIDAZOLE 1 % gel Commonly known as: METROGEL Apply topically daily.   omeprazole 40 MG capsule Commonly known as: PRILOSEC Take 1 capsule (40 mg total) by mouth daily.       No past medical history on file.  Past Surgical History:  Procedure Laterality Date  . CESAREAN SECTION  2001, 2003, 2007    Review of systems negative except as noted in HPI / PMHx or noted below:  Review of Systems  Constitutional: Negative.   HENT: Negative.   Eyes: Negative.   Respiratory: Negative.   Cardiovascular: Negative.   Gastrointestinal: Negative.   Genitourinary: Negative.   Musculoskeletal: Negative.   Skin: Negative.   Neurological: Negative.   Endo/Heme/Allergies: Negative.   Psychiatric/Behavioral: Negative.      Objective:   There were no vitals filed for this visit.        Physical Exam Constitutional:      Appearance: She is not  diaphoretic.  HENT:     Head: Normocephalic.     Right Ear: Tympanic membrane, ear canal and external ear normal.     Left Ear: Tympanic membrane, ear canal and external ear normal.     Nose: Nose normal. No mucosal edema or rhinorrhea.     Mouth/Throat:     Pharynx: Uvula midline. No oropharyngeal exudate.  Eyes:     Conjunctiva/sclera: Conjunctivae normal.  Neck:     Thyroid: No thyromegaly.     Trachea: Trachea normal. No tracheal tenderness or tracheal deviation.  Cardiovascular:     Rate and Rhythm: Normal rate and regular rhythm.     Heart sounds: Normal heart sounds, S1 normal and S2 normal. No murmur.  Pulmonary:     Effort: No respiratory distress.     Breath sounds: Normal breath sounds. No stridor. No wheezing or rales.  Lymphadenopathy:     Head:     Right side of head: No tonsillar adenopathy.     Left side of head: No tonsillar adenopathy.     Cervical: No cervical adenopathy.  Skin:    Findings: No erythema or rash.     Nails: There is no clubbing.  Neurological:     Mental Status: She is alert.     Diagnostics: none  Assessment and Plan:   1. Perennial allergic rhinitis   2. Migraine syndrome   3. LPRD (laryngopharyngeal reflux disease)   4. Perioral dermatitis   5. Anaphylaxis, initial encounter     1.  Blood - CBC w/D, CMP, Area 2 aeroallergen profile, Alpha-gal profile, TSH, FT4, TP   2.  Treat and prevent inflammation:   A.  OTC Rhinocort-1 spray each nostril daily  B.  Metronidazole 1% cream- apply to face daily  3.  Treat and prevent LPR:   A.  Consolidate caffeine use to AM  B.  Sample Dexilant 60 mg - 1 tablet 1 time per day  4.  Continue to treat and prevent headaches:   A.  Consolidate caffeine use to AM  5.  If needed:   A.  Cetirizine 10 mg - 1 tablet 1-2 times per day  B.  OTC Pataday -1 drop each eye 1 time per day  C.  Auvi-Q 0.3, benadryl, MD/ER evalution for allergic recation  6.  Consider starting a course of  immunotherapy following blood test results  7.  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  I have made an attempt to address each of these issues with the therapy noted above.  We will collect some blood test looking at the status of her atopic disease  and investigation of her anaphylaxis.  I will see her back in his clinic in 4 weeks or earlier if there is a problem.  Laurette Schimke, MD Allergy / Immunology [facility]

## 2019-05-11 NOTE — Patient Instructions (Addendum)
  1.  Blood - CBC w/D, CMP, Area 2 aeroallergen profile, Alpha-gal profile, TSH, FT4, TP   2.  Treat and prevent inflammation:   A.  OTC Rhinocort-1 spray each nostril daily  B.  Metronidazole 1% cream- apply to face daily  3.  Treat and prevent LPR:   A.  Consolidate caffeine use to AM  B.  Sample Dexilant 60 mg - 1 tablet 1 time per day  4.  Continue to treat and prevent headaches:   A.  Consolidate caffeine use to AM  5.  If needed:   A.  Cetirizine 10 mg - 1 tablet 1-2 times per day  B.  OTC Pataday -1 drop each eye 1 time per day  C.  Auvi-Q 0.3, benadryl, MD/ER evalution for allergic recation  6.  Consider starting a course of immunotherapy following blood test results  7.  Return to clinic in 4 weeks or earlier if problem

## 2019-05-12 ENCOUNTER — Encounter: Payer: Self-pay | Admitting: Allergy and Immunology

## 2019-05-17 LAB — COMPREHENSIVE METABOLIC PANEL
ALT: 13 IU/L (ref 0–32)
AST: 16 IU/L (ref 0–40)
Albumin/Globulin Ratio: 1.7 (ref 1.2–2.2)
Albumin: 4.7 g/dL (ref 3.8–4.8)
Alkaline Phosphatase: 81 IU/L (ref 39–117)
BUN/Creatinine Ratio: 19 (ref 9–23)
BUN: 16 mg/dL (ref 6–24)
Bilirubin Total: 0.8 mg/dL (ref 0.0–1.2)
CO2: 23 mmol/L (ref 20–29)
Calcium: 9.5 mg/dL (ref 8.7–10.2)
Chloride: 103 mmol/L (ref 96–106)
Creatinine, Ser: 0.85 mg/dL (ref 0.57–1.00)
GFR calc Af Amer: 93 mL/min/{1.73_m2} (ref >59)
GFR calc non Af Amer: 81 mL/min/{1.73_m2} (ref >59)
Globulin, Total: 2.8 g/dL (ref 1.5–4.5)
Glucose: 86 mg/dL (ref 65–99)
Potassium: 4.5 mmol/L (ref 3.5–5.2)
Sodium: 138 mmol/L (ref 134–144)
Total Protein: 7.5 g/dL (ref 6.0–8.5)

## 2019-05-17 LAB — CBC WITH DIFFERENTIAL
Basophils Absolute: 0.1 10*3/uL (ref 0.0–0.2)
Basos: 1 %
EOS (ABSOLUTE): 0.2 10*3/uL (ref 0.0–0.4)
Eos: 3 %
Hematocrit: 44.6 % (ref 34.0–46.6)
Hemoglobin: 14.3 g/dL (ref 11.1–15.9)
Immature Grans (Abs): 0 10*3/uL (ref 0.0–0.1)
Immature Granulocytes: 0 %
Lymphocytes Absolute: 1.3 10*3/uL (ref 0.7–3.1)
Lymphs: 23 %
MCH: 27.4 pg (ref 26.6–33.0)
MCHC: 32.1 g/dL (ref 31.5–35.7)
MCV: 85 fL (ref 79–97)
Monocytes Absolute: 0.7 10*3/uL (ref 0.1–0.9)
Monocytes: 12 %
Neutrophils Absolute: 3.4 10*3/uL (ref 1.4–7.0)
Neutrophils: 61 %
RBC: 5.22 x10E6/uL (ref 3.77–5.28)
RDW: 12.7 % (ref 11.7–15.4)
WBC: 5.6 10*3/uL (ref 3.4–10.8)

## 2019-05-17 LAB — IGE+ALLERGENS ZONE 2(30)
Alternaria Alternata IgE: 1.65 kU/L — AB
Amer Sycamore IgE Qn: <0.1 kU/L
Aspergillus Fumigatus IgE: <0.1 kU/L
Bahia Grass IgE: <0.1 kU/L
Bermuda Grass IgE: <0.1 kU/L
Cat Dander IgE: <0.1 kU/L
Cedar, Mountain IgE: <0.1 kU/L
Cladosporium Herbarum IgE: <0.1 kU/L
Cockroach, American IgE: <0.1 kU/L
Common Silver Birch IgE: <0.1 kU/L
D Farinae IgE: <0.1 kU/L
D Pteronyssinus IgE: <0.1 kU/L
Dog Dander IgE: <0.1 kU/L
Elm, American IgE: <0.1 kU/L
Hickory, White IgE: <0.1 kU/L
IgE (Immunoglobulin E), Serum: 82 IU/mL (ref 6–495)
Johnson Grass IgE: <0.1 kU/L
Maple/Box Elder IgE: <0.1 kU/L
Mucor Racemosus IgE: <0.1 kU/L
Mugwort IgE Qn: <0.1 kU/L
Nettle IgE: <0.1 kU/L
Oak, White IgE: <0.1 kU/L
Penicillium Chrysogen IgE: <0.1 kU/L
Pigweed, Rough IgE: <0.1 kU/L
Plantain, English IgE: <0.1 kU/L
Ragweed, Short IgE: <0.1 kU/L
Sheep Sorrel IgE Qn: <0.1 kU/L
Stemphylium Herbarum IgE: 0.48 kU/L — AB
Sweet gum IgE RAST Ql: <0.1 kU/L
Timothy Grass IgE: <0.1 kU/L
White Mulberry IgE: <0.1 kU/L

## 2019-05-17 LAB — TSH: TSH: 1.26 u[IU]/mL (ref 0.450–4.500)

## 2019-05-17 LAB — ALPHA-GAL PANEL
Alpha Gal IgE*: <0.1 kU/L (ref <0.10)
Beef (Bos spp) IgE: 0.78 kU/L — ABNORMAL HIGH (ref <0.35)
Class Interpretation: 0
Class Interpretation: 1
Class Interpretation: 2
Lamb/Mutton (Ovis spp) IgE: 0.52 kU/L — ABNORMAL HIGH (ref <0.35)
Pork (Sus spp) IgE: <0.1 kU/L (ref <0.35)

## 2019-05-17 LAB — T4, FREE: Free T4: 1.26 ng/dL (ref 0.82–1.77)

## 2019-06-15 ENCOUNTER — Ambulatory Visit: Payer: Managed Care, Other (non HMO) | Admitting: Allergy and Immunology

## 2019-06-22 ENCOUNTER — Ambulatory Visit: Payer: Managed Care, Other (non HMO) | Admitting: Allergy and Immunology

## 2020-06-28 ENCOUNTER — Other Ambulatory Visit: Payer: Self-pay

## 2020-07-03 ENCOUNTER — Other Ambulatory Visit: Payer: Self-pay | Admitting: *Deleted

## 2020-07-06 ENCOUNTER — Telehealth: Payer: Self-pay | Admitting: Allergy and Immunology

## 2020-07-06 NOTE — Telephone Encounter (Signed)
Patient called requesting refill for epi pen. Patient stated her pharmacy had attempted to get in touch with [place] for refill, however pharmacy had not been able to so patient was instructed to give [place] a call. I did explain to patient her last visit was 05/11/19 and that was probably the reason why she did not receive the refill. Patient did not understand why she needed to be seen if everything was the same for her. I did explain to patient per [facility]'s policy, patients need to be seen at least once every year in order to receive refills and an appointment would need to be made. Patient stated she did not need to come in and would find another way of receiving refill and hung up. I was not able to offer any appointment times in order to receive a brief courtesy refill.

## 2020-07-06 NOTE — Telephone Encounter (Signed)
FYI

## 2020-07-18 ENCOUNTER — Emergency Department (HOSPITAL_BASED_OUTPATIENT_CLINIC_OR_DEPARTMENT_OTHER): Payer: Managed Care, Other (non HMO)

## 2020-07-18 ENCOUNTER — Other Ambulatory Visit: Payer: Self-pay

## 2020-07-18 ENCOUNTER — Emergency Department (HOSPITAL_BASED_OUTPATIENT_CLINIC_OR_DEPARTMENT_OTHER)
Admission: EM | Admit: 2020-07-18 | Discharge: 2020-07-18 | Disposition: A | Discharge status: Home / Self Care | Payer: Managed Care, Other (non HMO) | Source: Home / Self Care | Attending: Emergency Medicine | Admitting: Emergency Medicine

## 2020-07-18 ENCOUNTER — Encounter (HOSPITAL_BASED_OUTPATIENT_CLINIC_OR_DEPARTMENT_OTHER): Payer: Self-pay | Admitting: Emergency Medicine

## 2020-07-18 ENCOUNTER — Emergency Department (HOSPITAL_BASED_OUTPATIENT_CLINIC_OR_DEPARTMENT_OTHER)
Admission: EM | Admit: 2020-07-18 | Discharge: 2020-07-18 | Disposition: A | Discharge status: Home / Self Care | Payer: Managed Care, Other (non HMO) | Attending: Emergency Medicine | Admitting: Emergency Medicine

## 2020-07-18 DIAGNOSIS — M79605 Pain in left leg: Secondary | ICD-10-CM | POA: Diagnosis present

## 2020-07-18 DIAGNOSIS — M5432 Sciatica, left side: Secondary | ICD-10-CM

## 2020-07-18 DIAGNOSIS — G5702 Lesion of sciatic nerve, left lower limb: Secondary | ICD-10-CM

## 2020-07-18 DIAGNOSIS — G5701 Lesion of sciatic nerve, right lower limb: Secondary | ICD-10-CM | POA: Insufficient documentation

## 2020-07-18 MED ORDER — DEXAMETHASONE SODIUM PHOSPHATE 10 MG/ML IJ SOLN
10.0000 mg | Freq: Once | INTRAMUSCULAR | Status: AC
  Administered 2020-07-18: 10 mg via INTRAMUSCULAR
  Filled 2020-07-18: qty 1

## 2020-07-18 MED ORDER — LIDOCAINE 5 % EX PTCH: 1.0000 | MEDICATED_PATCH | CUTANEOUS | 0 refills | Status: DC

## 2020-07-18 MED ORDER — GABAPENTIN 100 MG PO CAPS: 100.0000 mg | ORAL_CAPSULE | Freq: Three times a day (TID) | ORAL | 0 refills | Status: DC

## 2020-07-18 MED ORDER — GABAPENTIN 100 MG PO CAPS
100.0000 mg | ORAL_CAPSULE | Freq: Once | ORAL | Status: AC
  Administered 2020-07-18: 100 mg via ORAL
  Filled 2020-07-18: qty 1

## 2020-07-18 MED ORDER — KETOROLAC TROMETHAMINE 30 MG/ML IJ SOLN
30.0000 mg | Freq: Once | INTRAMUSCULAR | Status: AC
  Administered 2020-07-18: 30 mg via INTRAMUSCULAR
  Filled 2020-07-18: qty 1

## 2020-07-18 NOTE — ED Triage Notes (Signed)
Pt reports hip pain and radiates to her L leg that started Friday and worsened tonight - denies injury   Has hx sciatica

## 2020-07-18 NOTE — ED Triage Notes (Signed)
Pt reports pain to left buttocks/leg x5 days. Pt reports driving long distance that day. Pt reports the pain is "cramping" and "sharp." No fevers. Pt able to ambulate on leg. Was dx with piriformis syndrome of left side early this morning at ER.

## 2020-07-18 NOTE — ED Provider Notes (Signed)
[facility] EMERGENCY DEPT Provider Note  CSN: [id number] Arrival date & time: 07/18/20 6629  Chief Complaint(s) Leg Pain  HPI Sandra Velasquez is a 50 y.o. female    Leg Pain Location:  Buttock Time since incident:  3 days Injury: no   Buttock location:  L buttock Pain details:    Quality:  Aching and cramping   Radiates to:  L leg   Severity:  Moderate   Timing:  Constant   Progression:  Waxing and waning Chronicity:  Recurrent Associated symptoms: no decreased ROM, no fever, no muscle weakness, no swelling and no tingling    Past Medical History No past medical history on file. Patient Active Problem List   Diagnosis Date Noted   Chronic cough 12/14/2015   Laryngopharyngeal reflux (LPR) 12/14/2015   Perennial allergic rhinitis 12/14/2015   Hoarseness 12/14/2015   Muscle cramping 02/17/2014   Home Medication(s) Prior to Admission medications   Medication Sig Start Date End Date Taking? Authorizing Provider  azelastine (ASTELIN) 0.1 % nasal spray Place 2 sprays into both nostrils 2 (two) times daily. Use in each nostril as directed Patient not taking: Reported on 02/18/2019 06/02/18   Jessica Priest, MD  CRYSELLE-28 0.3-30 MG-MCG tablet Take 1 tablet by mouth daily. 08/25/17   [provider]  cyproheptadine (PERIACTIN) 4 MG tablet Take 1 tablet (4 mg total) by mouth 3 (three) times daily as needed for allergies. Patient not taking: Reported on 02/18/2019 06/02/18   Jessica Priest, MD  dexlansoprazole (DEXILANT) 60 MG capsule Take 1 capsule (60 mg total) by mouth daily. 05/11/19   Kozlow, Alvira Philips, MD  famotidine (PEPCID) 40 MG tablet Take 1 tablet (40 mg total) by mouth at bedtime. Patient not taking: Reported on 02/18/2019 06/02/18   Jessica Priest, MD  Ferrous Sulfate (IRON PO) Take 1 tablet by mouth daily.    [provider]  metroNIDAZOLE (METROGEL) 1 % gel Apply topically daily. 05/11/19   Kozlow, Alvira Philips, MD  Olopatadine HCl (PATADAY) 0.2 % SOLN  Place 1 drop into both eyes daily as needed. 05/11/19   Kozlow, Alvira Philips, MD  omeprazole (PRILOSEC) 40 MG capsule Take 1 capsule (40 mg total) by mouth daily. Patient not taking: Reported on 02/18/2019 06/02/18   Jessica Priest, MD                                                                                                                                    Past Surgical History Past Surgical History:  Procedure Laterality Date   CESAREAN SECTION     2001, 2003, 2007   Family History No family history on file.  Social History Social History   Tobacco Use   Smoking status: Never   Smokeless tobacco: Never  Substance Use Topics   Alcohol use: Never   Drug use: Never   Allergies Clarithromycin, Cyproheptadine, Amoxicillin-pot clavulanate, Erythromycin, and Penicillin g  Review of Systems Review of Systems  Constitutional:  Negative for fever.  All other systems are reviewed and are negative for acute change except as noted in the HPI  Physical Exam Vital Signs  I have reviewed the triage vital signs BP 134/89 (BP Location: Right Arm)   Pulse 94   Temp 97.6 F (36.4 C) (Oral)   Resp 18   Ht 5\' 6"  (1.676 m)   Wt 84.8 kg   LMP 07/03/2020   SpO2 98%   BMI 30.18 kg/m   Physical Exam Vitals reviewed.  Constitutional:      General: She is not in acute distress.    Appearance: She is well-developed. She is not diaphoretic.  HENT:     Head: Normocephalic and atraumatic.     Right Ear: External ear normal.     Left Ear: External ear normal.     Nose: Nose normal.  Eyes:     General: No scleral icterus.    Conjunctiva/sclera: Conjunctivae normal.  Neck:     Trachea: Phonation normal.  Cardiovascular:     Rate and Rhythm: Normal rate and regular rhythm.  Pulmonary:     Effort: Pulmonary effort is normal. No respiratory distress.     Breath sounds: No stridor.  Abdominal:     General: There is no distension.  Musculoskeletal:        General: Normal range of motion.      Cervical back: Normal range of motion.     Left upper leg: Tenderness present.       Legs:  Neurological:     Mental Status: She is alert and oriented to person, place, and time.  Psychiatric:        Behavior: Behavior normal.    ED Results and Treatments Labs (all labs ordered are listed, but only abnormal results are displayed) Labs Reviewed - No data to display                                                                                                                       EKG  EKG Interpretation  Date/Time:    Ventricular Rate:    PR Interval:    QRS Duration:   QT Interval:    QTC Calculation:   R Axis:     Text Interpretation:          Radiology No results found.  Pertinent labs & imaging results that were available during my care of the patient were reviewed by me and considered in my medical decision making (see chart for details).  Medications Ordered in ED Medications  ketorolac (TORADOL) 30 MG/ML injection 30 mg (30 mg Intramuscular Given 07/18/20 0426)  dexamethasone (DECADRON) injection 10 mg (10 mg Intramuscular Given 07/18/20 0426)  Procedures Procedures  (including critical care time)  Medical Decision Making / ED Course I have reviewed the nursing notes for this encounter and the patient's prior records (if available in EHR or on provided paperwork).   Sandra Velasquez was evaluated in Emergency Department on 07/18/2020 for the symptoms described in the history of present illness. She was evaluated in the context of the global COVID-19 pandemic, which necessitated consideration that the patient might be at risk for infection with the SARS-CoV-2 virus that causes COVID-19. Institutional protocols and algorithms that pertain to the evaluation of patients at risk for COVID-19 are in a state of rapid change based on  information released by regulatory bodies including the CDC and federal and state organizations. These policies and algorithms were followed during the patient's care in the ED.  Consistent with likely piriformis syndrome on the left. Continued supportive management recommended. Provided with decadron and toradol IM.      Final Clinical Impression(s) / ED Diagnoses Final diagnoses:  Piriformis syndrome of left side   The patient appears reasonably screened and/or stabilized for discharge and I doubt any other medical condition or other [facility] requiring further screening, evaluation, or treatment in the ED at this time prior to discharge. Safe for discharge with strict return precautions.  Disposition: Discharge  Condition: Good  I have discussed the results, Dx and Tx plan with the patient/family who expressed understanding and agree(s) with the plan. Discharge instructions discussed at length. The patient/family was given strict return precautions who verbalized understanding of the instructions. No further questions at time of discharge.    ED Discharge Orders     None        Follow Up: Jerral Ralph, MD [address] [place] [address] [phone]  Call  to schedule an appointment for close follow up      This chart was dictated using voice recognition software.  Despite best efforts to proofread,  errors can occur which can change the documentation meaning.    Nira Conn, MD 07/18/20 [phone]

## 2020-07-18 NOTE — ED Provider Notes (Signed)
[facility] EMERGENCY DEPT Provider Note   CSN: [id number] Arrival date & time: 07/18/20  1301     History Chief Complaint  Patient presents with   Leg Pain    Sandra Velasquez is a 50 y.o. female.  50 year old female with past medical history below who presents with left leg pain.  Patient states that last week, she drove a long distance and after the drive, she began having pain in her left buttock area that radiates down her entire left leg.  The pain is cramping and sharp.  She denies any associated low back pain.  No leg weakness/numbness, bowel/bladder incontinence, or fevers.  She was evaluated overnight for this pain and given some medication in the ED but reports that her symptoms have not improved.  She has been taking ibuprofen alternating with Tylenol at home without relief.  She has seen a spine specialist in the past for back problems but has not followed up recently.  The history is provided by the patient.  Leg Pain     History reviewed. No pertinent past medical history.  Patient Active Problem List   Diagnosis Date Noted   Chronic cough 12/14/2015   Laryngopharyngeal reflux (LPR) 12/14/2015   Perennial allergic rhinitis 12/14/2015   Hoarseness 12/14/2015   Muscle cramping 02/17/2014    Past Surgical History:  Procedure Laterality Date   CESAREAN SECTION     2001, 2003, 2007     OB History   No obstetric history on file.     History reviewed. No pertinent family history.  Social History   Tobacco Use   Smoking status: Never   Smokeless tobacco: Never  Substance Use Topics   Alcohol use: Never   Drug use: Never    Home Medications Prior to Admission medications   Medication Sig Start Date End Date Taking? Authorizing Provider  cyclobenzaprine (FLEXERIL) 10 MG tablet Take 10 mg by mouth 2 (two) times daily as needed.   Yes [provider]  gabapentin (NEURONTIN) 100 MG capsule Take 1 capsule (100 mg total) by mouth 3  (three) times daily. 07/18/20 08/17/20 Yes Kiela Shisler, Ambrose Finland, MD  ibuprofen (ADVIL) 200 MG tablet Take 200 mg by mouth every 6 (six) hours as needed.   Yes [provider]  lidocaine (LIDODERM) 5 % Place 1 patch onto the skin daily. Remove & Discard patch within 12 hours or as directed by MD 07/18/20  Yes Sarann Tregre, Ambrose Finland, MD  Loratadine 10 MG CHEW Chew 10 mg by mouth daily.   Yes [provider]  norgestrel-ethinyl estradiol (LOW-OGESTREL) 0.3-30 MG-MCG tablet Take 1 tablet by mouth daily.   Yes [provider]  Olopatadine HCl (PATADAY) 0.2 % SOLN Place 1 drop into both eyes daily as needed. 05/11/19  Yes Kozlow, Alvira Philips, MD  pantoprazole (PROTONIX) 40 MG tablet Take 40 mg by mouth daily. 06/16/20  Yes [provider]  valACYclovir (VALTREX) 500 MG tablet Take 500 mg by mouth See admin instructions. 2 tabs twice daily with cold sore,1 tab for suppression   Yes [provider]  azelastine (ASTELIN) 0.1 % nasal spray Place 2 sprays into both nostrils 2 (two) times daily. Use in each nostril as directed Patient not taking: Reported on 02/18/2019 06/02/18   Jessica Priest, MD  CRYSELLE-28 0.3-30 MG-MCG tablet Take 1 tablet by mouth daily. Patient not taking: Reported on 07/18/2020 08/25/17   [provider]  cyproheptadine (PERIACTIN) 4 MG tablet Take 1 tablet (4 mg total) by  mouth 3 (three) times daily as needed for allergies. Patient not taking: Reported on 02/18/2019 06/02/18   Jessica Priest, MD  dexlansoprazole (DEXILANT) 60 MG capsule Take 1 capsule (60 mg total) by mouth daily. Patient not taking: Reported on 07/18/2020 05/11/19   Jessica Priest, MD  famotidine (PEPCID) 40 MG tablet Take 1 tablet (40 mg total) by mouth at bedtime. Patient not taking: Reported on 02/18/2019 06/02/18   Jessica Priest, MD  metroNIDAZOLE (METROGEL) 1 % gel Apply topically daily. Patient not taking: Reported on 07/18/2020 05/11/19   Jessica Priest, MD  omeprazole (PRILOSEC) 40  MG capsule Take 1 capsule (40 mg total) by mouth daily. Patient not taking: Reported on 02/18/2019 06/02/18   Jessica Priest, MD    Allergies    Clarithromycin, Cyproheptadine, Amoxicillin-pot clavulanate, Erythromycin, and Penicillin g  Review of Systems   Review of Systems All other systems reviewed and are negative except that which was mentioned in HPI  Physical Exam Updated Vital Signs BP 114/83   Pulse (!) 101   Temp 98.7 F (37.1 C) (Oral)   Resp 16   LMP 07/03/2020   SpO2 97%   Physical Exam Vitals and nursing note reviewed.  Constitutional:      General: She is not in acute distress.    Appearance: Normal appearance.     Comments: Moving around in bed and pacing room, uncomfortable  HENT:     Head: Normocephalic and atraumatic.  Eyes:     Conjunctiva/sclera: Conjunctivae normal.  Cardiovascular:     Rate and Rhythm: Normal rate and regular rhythm.     Pulses: Normal pulses.     Heart sounds: Normal heart sounds. No murmur heard. Pulmonary:     Effort: Pulmonary effort is normal.     Breath sounds: Normal breath sounds.  Musculoskeletal:        General: Normal range of motion.     Right lower leg: No edema.     Left lower leg: No edema.     Comments: No focal tenderness of L posterior leg or buttock, no skin changes or edema  Skin:    General: Skin is warm and dry.  Neurological:     Mental Status: She is alert and oriented to person, place, and time.     Sensory: No sensory deficit.     Motor: No weakness.     Deep Tendon Reflexes: Reflexes normal.     Comments: fluent  Psychiatric:        Thought Content: Thought content normal.    ED Results / Procedures / Treatments   Labs (all labs ordered are listed, but only abnormal results are displayed) Labs Reviewed - No data to display  EKG None  Radiology US Venous Img Lower  Left (DVT Study)  Result Date: 07/18/2020 CLINICAL DATA:  Left buttock and leg cramping post long car ride EXAM: LEFT LOWER  EXTREMITY VENOUS DOPPLER ULTRASOUND TECHNIQUE: Gray-scale sonography with compression, as well as color and duplex ultrasound, were performed to evaluate the deep venous system(s) from the level of the common femoral vein through the popliteal and proximal calf veins. COMPARISON:  None. FINDINGS: VENOUS Normal compressibility of the common femoral, superficial femoral, and popliteal veins, as well as the visualized calf veins. Visualized portions of profunda femoral vein and great saphenous vein unremarkable. No filling defects to suggest DVT on grayscale or color Doppler imaging. Doppler waveforms show normal direction of venous flow, normal respiratory phasicity and response to  augmentation. Limited views of the contralateral common femoral vein are unremarkable. OTHER None. Limitations: none IMPRESSION: No femoropopliteal DVT nor evidence of DVT within the visualized calf veins. If clinical symptoms are inconsistent or if there are persistent or worsening symptoms, further imaging (possibly involving the iliac veins) may be warranted. Electronically Signed   By: Corlis Leak M.D.   On: 07/18/2020 17:12    Procedures Procedures   Medications Ordered in ED Medications  gabapentin (NEURONTIN) capsule 100 mg (100 mg Oral Given 07/18/20 1649)    ED Course  I have reviewed the triage vital signs and the nursing notes.  Pertinent labs & imaging results that were available during my care of the patient were reviewed by me and considered in my medical decision making (see chart for details).    MDM Rules/Calculators/A&P                          Neurologically intact on exam.  DVT ultrasound was negative.  Her description of pins-and-needles type pain radiating down her leg suggests neuropathic pain such as with sciatica.  She does have history of back problems and previous MRI showed lower lumbar disc herniations.  She has already received Decadron earlier today.  Will start on low-dose of gabapentin and  have her follow-up with Dr. Lorenso Courier for further management options.  I have extensively reviewed supportive measures including alternating Tylenol and NSAIDs, lidocaine patches, muscle relaxants.  Return precautions reviewed. Final Clinical Impression(s) / ED Diagnoses Final diagnoses:  Sciatica of left side    Rx / DC Orders ED Discharge Orders          Ordered    gabapentin (NEURONTIN) 100 MG capsule  3 times daily        07/18/20 1758    lidocaine (LIDODERM) 5 %  Every 24 hours        07/18/20 1758             Anylah Scheib, Ambrose Finland, MD 07/19/20 [phone]

## 2020-07-18 NOTE — ED Notes (Signed)
Pt left before this RN could go over discharge instructions. MD spoke to Pt and then was given discharge paperwork. I called Husband Pt who was present to make sure everything the MD told them was understood. He stated that he understood everything, that they had the paper work and were at the pharmacy picking up the medication. The Pt stated that all was good and they understand evrything perfectly.

## 2020-07-26 ENCOUNTER — Emergency Department (HOSPITAL_COMMUNITY)
Admission: EM | Admit: 2020-07-26 | Discharge: 2020-07-26 | Disposition: A | Discharge status: Home / Self Care | Payer: Managed Care, Other (non HMO) | Attending: Emergency Medicine | Admitting: Emergency Medicine

## 2020-07-26 ENCOUNTER — Encounter (HOSPITAL_COMMUNITY): Payer: Self-pay

## 2020-07-26 ENCOUNTER — Emergency Department (HOSPITAL_COMMUNITY): Payer: Managed Care, Other (non HMO)

## 2020-07-26 ENCOUNTER — Other Ambulatory Visit: Payer: Self-pay

## 2020-07-26 DIAGNOSIS — M5432 Sciatica, left side: Secondary | ICD-10-CM

## 2020-07-26 DIAGNOSIS — M5442 Lumbago with sciatica, left side: Secondary | ICD-10-CM | POA: Insufficient documentation

## 2020-07-26 DIAGNOSIS — M545 Low back pain, unspecified: Secondary | ICD-10-CM | POA: Diagnosis present

## 2020-07-26 MED ORDER — PREDNISONE 10 MG (21) PO TBPK: ORAL_TABLET | Freq: Every day | ORAL | 0 refills | Status: DC

## 2020-07-26 MED ORDER — OXYCODONE-ACETAMINOPHEN 5-325 MG PO TABS
1.0000 | ORAL_TABLET | Freq: Once | ORAL | Status: AC
  Administered 2020-07-26: 1 via ORAL
  Filled 2020-07-26: qty 1

## 2020-07-26 MED ORDER — OXYCODONE-ACETAMINOPHEN 5-325 MG PO TABS: 1.0000 | ORAL_TABLET | Freq: Three times a day (TID) | ORAL | 0 refills | Status: DC | PRN

## 2020-07-26 NOTE — ED Triage Notes (Signed)
Patient complains of severe lower back pain radiating down left leg. Has been seen at [facility] for same and no relief with steroids and gabapentin. Had MRI in April that showed disc disease

## 2020-07-26 NOTE — Discharge Instructions (Addendum)
Please call [facility] first thing tomorrow morning.  I spoke to them over the phone this evening had they tell me that they have availability to evaluate you tomorrow.  I have prescribed you a strong narcotic called Percocet. Please only take this as prescribed. This medication also has tylenol in it, so please be sure you are not taking more than 3000 mg of tylenol per day. Do not drive or operate heavy machinery after taking this medication. Do not mix it with alcohol.   I am prescribing you a strong steroid medication called prednisone.  Please only take this as prescribed.  Please take it early in the morning, as this medication can be stimulating and make it difficult to sleep at night.  If you develop any numbness, tingling, bowel or bladder incontinence, or weakness in the legs, please come back to the emergency department immediately.  It was a pleasure to meet you.

## 2020-07-26 NOTE — ED Provider Notes (Signed)
Emergency Medicine Provider Triage Evaluation Note  Briceyda Abdullah , a 50 y.o. female  was evaluated in triage.  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  She is here today because she is having severe pain, was seen at [facility] on the 7 was given gabapentin and a steroid Dosepak for  pain.  She has no red flag symptoms able to ambulate around the room.  She is demanding a MRI, explained her that there are no red flag symptoms at this time, an MRI would not solve the issue, she needs to follow-up with a neurosurgeon, she endorsed that she does not care and would rather wait in the waiting room for an MRI.  will order this provided pain management.  Review of Systems  Positive: Left back pain, left leg pain. Negative: Urinary symptoms nausea vomiting  Physical Exam  BP 101/71 (BP Location: Right Arm)   Pulse 96   Temp 98.1 F (36.7 C)   Resp 18   LMP 07/03/2020   SpO2 100%  Gen:   Awake, no distress   Resp:  Normal effort  MSK:   Moves extremities without difficulty  Other:    Medical Decision Making  Medically screening exam initiated at 1:55 PM.  Appropriate orders placed.  Ilissa Lazarus was informed that the remainder of the evaluation will be completed by another provider, this initial triage assessment does not replace that evaluation, and the importance of remaining in the ED until their evaluation is complete.  Presents with chronic left lower back pain, imaging have been ordered, patient need further work-up.   Carroll Sage, PA-C 07/26/20 1405    Jacalyn Lefevre, MD 07/26/20 1435

## 2020-07-26 NOTE — ED Provider Notes (Signed)
[facility] EMERGENCY DEPARTMENT Provider Note   CSN: [id number] Arrival date & time: 07/26/20  1311     History No chief complaint on file.   Sandra Velasquez is a 50 y.o. female.  HPI Patient is a 50 year old female with a medical history as noted below.  She presents to the emergency department due to left back and leg pain that has been ongoing for about 6 months.  Patient reports a history of muscle spasms in the region as well as recurrent sciatica.  About 2 weeks ago she states that she was exerting herself and [place]  Afterwards she began experiencing severe pain that radiates down the left leg.  Pain worsens significantly when bearing weight and ambulating.  Reports a radiation of pain down the left leg into the left foot.  Associated tingling but no numbness.  No weakness.  No bowel or bladder incontinence or saddle anesthesia.  She has been evaluated multiple times for this in the emergency department as well as at the spine center with [facility].  She is currently taking cyclobenzaprine, Tylenol, Advil, and finished a prednisone taper today.  She reports mild relief with the prednisone but little relief with the other 3 medications.  MRI lumbar spine April 20, 2020 with findings as noted: Mild left foraminal stenosis at L3-L4 and L5-S1. No substantial lumbar spinalcanal stenosis.     History reviewed. No pertinent past medical history.  Patient Active Problem List   Diagnosis Date Noted   Chronic cough 12/14/2015   Laryngopharyngeal reflux (LPR) 12/14/2015   Perennial allergic rhinitis 12/14/2015   Hoarseness 12/14/2015   Muscle cramping 02/17/2014    Past Surgical History:  Procedure Laterality Date   CESAREAN SECTION     2001, 2003, 2007     OB History   No obstetric history on file.     No family history on file.  Social History   Tobacco Use   Smoking status: Never   Smokeless tobacco: Never   Substance Use Topics   Alcohol use: Never   Drug use: Never    Home Medications Prior to Admission medications   Medication Sig Start Date End Date Taking? Authorizing Provider  oxyCODONE-acetaminophen (PERCOCET/ROXICET) 5-325 MG tablet Take 1 tablet by mouth every 8 (eight) hours as needed for severe pain. 07/26/20  Yes Placido Sou, PA-C  predniSONE (STERAPRED UNI-PAK 21 TAB) 10 MG (21) TBPK tablet Take by mouth daily. Take 6 tabs by mouth daily  for 2 days, then 5 tabs for 2 days, then 4 tabs for 2 days, then 3 tabs for 2 days, 2 tabs for 2 days, then 1 tab by mouth daily for 2 days 07/26/20  Yes Mariah Milling, Inella Kuwahara, PA-C  azelastine (ASTELIN) 0.1 % nasal spray Place 2 sprays into both nostrils 2 (two) times daily. Use in each nostril as directed Patient not taking: Reported on 02/18/2019 06/02/18   Jessica Priest, MD  CRYSELLE-28 0.3-30 MG-MCG tablet Take 1 tablet by mouth daily. Patient not taking: Reported on 07/18/2020 08/25/17   [provider]  cyclobenzaprine (FLEXERIL) 10 MG tablet Take 10 mg by mouth 2 (two) times daily as needed.    [provider]  cyproheptadine (PERIACTIN) 4 MG tablet Take 1 tablet (4 mg total) by mouth 3 (three) times daily as needed for allergies. Patient not taking: Reported on 02/18/2019 06/02/18   Jessica Priest, MD  dexlansoprazole (DEXILANT) 60 MG capsule Take 1 capsule (60 mg  total) by mouth daily. Patient not taking: Reported on 07/18/2020 05/11/19   Jessica Priest, MD  famotidine (PEPCID) 40 MG tablet Take 1 tablet (40 mg total) by mouth at bedtime. Patient not taking: Reported on 02/18/2019 06/02/18   Jessica Priest, MD  gabapentin (NEURONTIN) 100 MG capsule Take 1 capsule (100 mg total) by mouth 3 (three) times daily. 07/18/20 08/17/20  Little, Ambrose Finland, MD  ibuprofen (ADVIL) 200 MG tablet Take 200 mg by mouth every 6 (six) hours as needed.    [provider]  lidocaine (LIDODERM) 5 % Place 1 patch onto the skin daily. Remove & Discard  patch within 12 hours or as directed by MD 07/18/20   Little, Ambrose Finland, MD  Loratadine 10 MG CHEW Chew 10 mg by mouth daily.    [provider]  metroNIDAZOLE (METROGEL) 1 % gel Apply topically daily. Patient not taking: Reported on 07/18/2020 05/11/19   Jessica Priest, MD  norgestrel-ethinyl estradiol (LOW-OGESTREL) 0.3-30 MG-MCG tablet Take 1 tablet by mouth daily.    [provider]  Olopatadine HCl (PATADAY) 0.2 % SOLN Place 1 drop into both eyes daily as needed. 05/11/19   Kozlow, Alvira Philips, MD  omeprazole (PRILOSEC) 40 MG capsule Take 1 capsule (40 mg total) by mouth daily. Patient not taking: Reported on 02/18/2019 06/02/18   Jessica Priest, MD  pantoprazole (PROTONIX) 40 MG tablet Take 40 mg by mouth daily. 06/16/20   [provider]  valACYclovir (VALTREX) 500 MG tablet Take 500 mg by mouth See admin instructions. 2 tabs twice daily with cold sore,1 tab for suppression    [provider]    Allergies    Clarithromycin, Cyproheptadine, Amoxicillin-pot clavulanate, Erythromycin, and Penicillin g  Review of Systems   Review of Systems  All other systems reviewed and are negative. Ten systems reviewed and are negative for acute change, except as noted in the HPI.   Physical Exam Updated Vital Signs BP 126/84 (BP Location: Left Arm)   Pulse 95   Temp 98.1 F (36.7 C)   Resp 18   LMP 07/03/2020   SpO2 98%   Physical Exam Vitals and nursing note reviewed.  Constitutional:      General: She is not in acute distress.    Appearance: Normal appearance. She is not ill-appearing, toxic-appearing or diaphoretic.  HENT:     Head: Normocephalic and atraumatic.     Right Ear: External ear normal.     Left Ear: External ear normal.     Nose: Nose normal.     Mouth/Throat:     Mouth: Mucous membranes are moist.     Pharynx: Oropharynx is clear. No oropharyngeal exudate or posterior oropharyngeal erythema.  Eyes:     General: No scleral icterus.       Right  eye: No discharge.        Left eye: No discharge.     Extraocular Movements: Extraocular movements intact.     Conjunctiva/sclera: Conjunctivae normal.     Pupils: Pupils are equal, round, and reactive to light.  Cardiovascular:     Rate and Rhythm: Normal rate and regular rhythm.     Pulses: Normal pulses.     Heart sounds: Normal heart sounds. No murmur heard.   No friction rub. No gallop.  Pulmonary:     Effort: Pulmonary effort is normal. No respiratory distress.     Breath sounds: Normal breath sounds. No stridor. No wheezing, rhonchi or rales.  Abdominal:  General: Abdomen is flat.     Palpations: Abdomen is soft.     Tenderness: There is no abdominal tenderness.  Musculoskeletal:        General: Tenderness present. Normal range of motion.     Cervical back: Normal range of motion and neck supple. No tenderness.     Comments: Mild to moderate tenderness noted in the left gluteal region.  No midline spine pain.  Skin:    General: Skin is warm and dry.  Neurological:     General: No focal deficit present.     Mental Status: She is alert and oriented to person, place, and time.     Comments: Strength is 5 out of 5 with plantar flexion and dorsi flexion of the bilateral lower extremities.  Distal sensation intact.  2+ pedal pulses.  Ambulatory with an antalgic gait.  Psychiatric:        Mood and Affect: Mood normal.        Behavior: Behavior normal.   ED Results / Procedures / Treatments   Labs (all labs ordered are listed, but only abnormal results are displayed) Labs Reviewed  URINALYSIS, DIPSTICK ONLY   EKG None  Radiology MR LUMBAR SPINE WO CONTRAST  Result Date: 07/26/2020 CLINICAL DATA:  Low back pain and left leg pain since 07/14/2020 EXAM: MRI LUMBAR SPINE WITHOUT CONTRAST TECHNIQUE: Multiplanar, multisequence MR imaging of the lumbar spine was performed. No intravenous contrast was administered. COMPARISON:  X-ray 02/07/2017 FINDINGS: Segmentation:  Standard.  Alignment:  Physiologic. Vertebrae: No fracture, evidence of discitis, or bone lesion. Discogenic endplate marrow changes at L5-S1. Conus medullaris and cauda equina: Conus extends to the L1 level. Conus and cauda equina appear normal. Paraspinal and other soft tissues: Cortically based T2 hyperintense lesionswithin the bilateral kidneys, incompletely characterized, but most likely represent cysts. Disc levels: T12-L1 through L4-L5: Mild facet arthropathy throughout the lumbar spine. No disc protrusion. No foraminal or canal stenosis. L5-S1: Moderate-large disc protrusion in the left subarticular zone impinging the descending left S1 nerve root. Mild bilateral facet arthrosis. No foraminal or canal stenosis. IMPRESSION: 1. Moderate-large disc protrusion in the left subarticular zone at L5-S1 impinging the descending left S1 nerve root. 2. No foraminal or canal stenosis at any level. 3. Mild facet arthropathy of the lumbar spine. Electronically Signed   By: Duanne GuessNicholas  Plundo D.O.   On: 07/26/2020 15:04    Procedures Procedures   Medications Ordered in ED Medications  oxyCODONE-acetaminophen (PERCOCET/ROXICET) 5-325 MG per tablet 1 tablet (1 tablet Oral Given 07/26/20 1359)    ED Course  I have reviewed the triage vital signs and the nursing notes.  Pertinent labs & imaging results that were available during my care of the patient were reviewed by me and considered in my medical decision making (see chart for details).    MDM Rules/Calculators/A&P                          Patient is a 50 year old female with a history of recurrent sciatica on the left side who presents to the emergency department with worsening left-sided sciatic symptoms that started about 2 weeks ago.  She is taking Advil, Tylenol, cyclobenzaprine, as well as prednisone with minimal relief.  She has been evaluated multiple times in the emergency department as well as at the spine center with [facility] in the past 2 weeks.    MRI of  the lumbar spine was ordered while patient was in triage.  It  shows moderate to large disc protrusion in the left subarticular zone at L5-S1 impinging on the descending left S1 nerve root.  There is no foraminal or canal stenosis at any level.  Mild facet arthropathy of the lumbar spine.  Patient appears to be in obvious pain with very mild relief with the Percocet that was given in triage.  Physical exam is mostly reassuring.  She does have some tenderness in the left gluteal region but no midline spine pain.  No obvious deficits in the lower extremities.  Appears neurovascular intact in both legs.  Denies any numbness, weakness, or bowel or bladder incontinence.  Doubt cauda equina at this time.  Patient requests neurosurgery referral in the [place] area.  I spoke to neurosurgery and they can evaluate the patient in their clinic tomorrow.  This was discussed with the patient and she states that she is going to call them first thing in the morning.  Will discharge patient with additional prednisone as well as short course of Percocet.  We discussed safety and dosing regarding these medications.  Feel that she is stable for discharge at this time and she is agreeable.  Given strict return precautions.  Her questions were answered and she was amicable at the time of discharge.  Final Clinical Impression(s) / ED Diagnoses Final diagnoses:  Sciatica of left side   Rx / DC Orders ED Discharge Orders          Ordered    oxyCODONE-acetaminophen (PERCOCET/ROXICET) 5-325 MG tablet  Every 8 hours PRN        07/26/20 1839    predniSONE (STERAPRED UNI-PAK 21 TAB) 10 MG (21) TBPK tablet  Daily        07/26/20 1839             Placido Sou, PA-C 07/26/20 1846    Alvira Monday, MD 07/27/20 1039

## 2020-08-02 ENCOUNTER — Ambulatory Visit: Payer: Self-pay | Admitting: Podiatry

## 2020-08-23 ENCOUNTER — Other Ambulatory Visit: Payer: Self-pay

## 2020-08-23 ENCOUNTER — Ambulatory Visit (INDEPENDENT_AMBULATORY_CARE_PROVIDER_SITE_OTHER): Payer: Managed Care, Other (non HMO)

## 2020-08-23 ENCOUNTER — Ambulatory Visit (INDEPENDENT_AMBULATORY_CARE_PROVIDER_SITE_OTHER): Payer: Managed Care, Other (non HMO) | Admitting: Podiatry

## 2020-08-23 DIAGNOSIS — M722 Plantar fascial fibromatosis: Secondary | ICD-10-CM | POA: Diagnosis not present

## 2020-08-23 DIAGNOSIS — M21622 Bunionette of left foot: Secondary | ICD-10-CM | POA: Diagnosis not present

## 2020-08-23 DIAGNOSIS — M21621 Bunionette of right foot: Secondary | ICD-10-CM | POA: Diagnosis not present

## 2020-08-23 NOTE — Progress Notes (Addendum)
   HPI: 50 y.o. female presenting today as a new patient for evaluation of pain and tenderness associated to the fifth toe of the left foot.  Patient recently underwent back surgery in the L4-L5 region about 3 weeks ago.  Patient states that she is doing well however she continues to have some numbness in her calf and foot to the left lower extremity.  Patient also states that for several weeks she has had pain and tenderness around the fifth toe of the left foot.  She currently is not on anything for treatment.    Finally also the patient states that she has been feeling a lump underneath her left foot.  Minimally symptomatic.  She says that it does change in size on occasion.  She denies a history of injury.  She is unsure when the lump developed however she has noticed that recently.  They can be aggravated by certain shoes.  She presents for further treatment and evaluation  No past medical history on file.   Physical Exam: General: The patient is alert and oriented x3 in no acute distress.  Dermatology: Skin is warm, dry and supple bilateral lower extremities. Negative for open lesions or macerations.  Vascular: Palpable pedal pulses bilaterally. No edema or erythema noted. Capillary refill within normal limits.  Neurological: Epicritic and protective threshold grossly intact bilaterally.   Musculoskeletal Exam: Clinical evidence of a tailor's bunionette deformity noted left foot with a hypertrophic lateral eminence of the fifth metatarsal head especially noted during weightbearing.  There is some associated tenderness to palpation along around the fifth MTPJ.  There is also a palpable mass noted along the medial longitudinal arch of the foot along the plantar fascia which findings appear to be consistent with a plantar fibroma.  There is some associated tenderness to palpation  Radiographic Exam:  Normal osseous mineralization. Joint spaces preserved. No fracture/dislocation/boney  destruction.  Tailor's bunion noted with increased intermetatarsal angle between the fourth and fifth metatarsals of the left foot and lateral deviation of the fifth metatarsal head  Assessment: 1.  Tailor's bunion/fifth MTPJ capsulitis left 2.  Plantar fibroma left   Plan of Care:  1. Patient evaluated. X-Rays reviewed.  2.  Recommend that the patient wears wide fitting shoes 3.  Advised against going barefoot.  Recommend good supportive shoes and sandals.  Vionic sandals were recommended 4.  In regards to the plantar fibroma, the lesion is minimally symptomatic.  Recommend conservative treatment for now.  Recommend shoes that do not aggravate the lesion.   5.  Return to clinic as needed      Felecia Shelling, DPM [facility]  Dr. Felecia Shelling, DPM    [address] [place], [place] [address]                Office [phone]  Fax [phone]

## 2020-08-23 NOTE — Progress Notes (Signed)
g

## 2020-08-28 ENCOUNTER — Encounter: Payer: Self-pay | Admitting: Podiatry

## 2020-08-29 ENCOUNTER — Other Ambulatory Visit: Payer: Self-pay | Admitting: Podiatry

## 2020-08-29 DIAGNOSIS — M722 Plantar fascial fibromatosis: Secondary | ICD-10-CM

## 2020-08-29 NOTE — Progress Notes (Signed)
Patient concerned for the plantar fibroma of the left foot.  Requesting MRI.  MRI ordered.  Will contact patient with results.

## 2020-09-15 ENCOUNTER — Other Ambulatory Visit: Payer: Self-pay

## 2020-09-15 ENCOUNTER — Ambulatory Visit
Admission: RE | Admit: 2020-09-15 | Discharge: 2020-09-15 | Disposition: A | Discharge status: Home / Self Care | Payer: Managed Care, Other (non HMO) | Source: Ambulatory Visit | Attending: Podiatry | Admitting: Podiatry

## 2020-09-15 DIAGNOSIS — M722 Plantar fascial fibromatosis: Secondary | ICD-10-CM

## 2020-09-25 ENCOUNTER — Other Ambulatory Visit: Payer: Self-pay

## 2020-09-25 ENCOUNTER — Ambulatory Visit (INDEPENDENT_AMBULATORY_CARE_PROVIDER_SITE_OTHER): Payer: Managed Care, Other (non HMO) | Admitting: Podiatry

## 2020-09-25 DIAGNOSIS — M67479 Ganglion, unspecified ankle and foot: Secondary | ICD-10-CM

## 2020-09-25 DIAGNOSIS — M21622 Bunionette of left foot: Secondary | ICD-10-CM

## 2020-09-25 DIAGNOSIS — M722 Plantar fascial fibromatosis: Secondary | ICD-10-CM | POA: Diagnosis not present

## 2020-09-26 NOTE — Progress Notes (Signed)
HPI: 50 y.o. female presenting today for follow-up evaluation of pain and tenderness associated to the fifth toe of the left foot.  Patient recently underwent back surgery in the L4-L5 region about 3 weeks ago.  Patient states that she is doing well however she continues to have some numbness in her calf and foot to the left lower extremity.  Since last visit the patient had requested an MRI and she presents today to discuss the MRI and further treatment options  No past medical history on file.   Physical Exam: General: The patient is alert and oriented x3 in no acute distress.  Dermatology: Skin is warm, dry and supple bilateral lower extremities. Negative for open lesions or macerations.  Vascular: Palpable pedal pulses bilaterally. No edema or erythema noted. Capillary refill within normal limits.  Neurological: Epicritic and protective threshold grossly intact bilaterally.   Musculoskeletal Exam: Clinical evidence of a tailor's bunionette deformity noted left foot with a hypertrophic lateral eminence of the fifth metatarsal head especially noted during weightbearing.  There is some associated tenderness to palpation along around the fifth MTPJ.  There is also a palpable mass noted along the medial longitudinal arch of the foot along the plantar fascia which findings appear to be consistent with a plantar fibroma.  There is some associated tenderness to palpation  Radiographic Exam taken 08/23/2020 LT foot:  Normal osseous mineralization. Joint spaces preserved. No fracture/dislocation/boney destruction.  Tailor's bunion noted with increased intermetatarsal angle between the fourth and fifth metatarsals of the left foot and lateral deviation of the fifth metatarsal head  MR foot LT wo contrast 09/15/2020: Soft tissues   Within the area of concern along the plantar lateral forefoot, there is a T2 hyperintense/T1 hypointense lesion measuring 1.1 x 0.6 x 1.1 cm (axial T2 image 14,  sagittal STIR image 5). This is adjacent to the fifth MTP joint. There is no evidence of intermetatarsal neuroma.   IMPRESSION: T2 hyperintense/T1 hypointense lesion measuring 1.1 x 0.6 x 1.1 cm along the plantar lateral forefoot adjacent to the fifth MTP joint. This is most likely a ganglion cyst, and less likely an adventitial bursa.  Assessment: 1.  Tailor's bunion/fifth MTPJ capsulitis left w/ evidence of cyst or bursa 2.  Plantar fibroma left   Plan of Care:  1. Patient evaluated. MRI reviewed.  Today we discussed the MRI findings and the patient's symptoms.  The majority of the patient's symptoms are localized around the fifth MTPJ and she says that the pain has been ongoing since January 2022.   2. Today we discussed the conservative versus surgical management of the presenting pathology.  We also discussed in detail the pros and cons of surgery.  Unfortunately I explained to the patient that I cannot guarantee that removing the cyst will alleviate all of her symptoms of the foot, however I do believe it should help since this is the area of pain.  The patient opts for surgical management. All possible complications and details of the procedure were explained. All patient questions were answered. No guarantees were expressed or implied. 3. Authorization for surgery was initiated today. Surgery will consist of excision of ganglion on/bursa left fifth MTPJ.  Tailor's bunionectomy without osteotomy left 4.  Return to clinic 1 week postop      Felecia Shelling, DPM [facility]  Dr. Felecia Shelling, DPM    [address]  [place], [place] [address]                Office [phone]  Fax [phone]

## 2020-11-01 NOTE — Progress Notes (Signed)
Cardiology Office Note:    Date:  11/02/2020   ID:  Sandra Velasquez, DOB [birth year], MRN [id number]  PCP:  [facility]  Cardiologist:  None  Electrophysiologist:  None   Referring MD: Jerral Ralph, MD   Chief Complaint/Reason for Referral: Palpitations  History of Present Illness:    Sandra Velasquez is a 50 y.o. female with a history of fatigue, iron deficiency/anemia, and GERD, here for the evaluation of palpitations.  From 7/5-7/13/2022 she was evaluated in the ED on multiple occasions for worsening symptoms of left-sided sciatica.  Initially, she experienced palpitations 9-10 years ago that she attributes to stress from family issues. Once stressors were removed the palpitations resolved until about one month ago. Lately her palpitations have been occurring intermittently but at least once daily for a few weeks. She notes some inconsistency with durations of 3 to 30 seconds at a time. They are most noticeable at night when she is more calm. She has not noticed any palpitations in the last couple days.  Also she has shortness of breath with activity, associated with chest tightness. When this occurs, she is unable to take a deep breath. Of note, she endorses a history of exercise-induced asthma when playing sports in high school and college.  Father had CABG x4, stents, died at 10 yo. Mother has hypertension.  The patient denies dyspnea at rest, PND, orthopnea, or leg swelling. Denies cough, fever, chills. Denies nausea, vomiting. Denies syncope or presyncope. Denies dizziness or lightheadedness.   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  She does not believe she has ever been infected with COVID.   No past medical history on file.  Past Surgical History:  Procedure Laterality Date   CESAREAN SECTION     2001, 2003, 2007    Current Medications: Current Meds  Medication Sig   cetirizine (ZYRTEC) 10 MG  tablet    EPINEPHrine 0.3 mg/0.3 mL IJ SOAJ injection epinephrine 0.3 mg/0.3 mL injection, auto-injector  INJECT INTO THIGH AS INSTRUCTED AS NEEDED FOR ALLERGIC REACTION   ibuprofen (ADVIL) 200 MG tablet Take 200 mg by mouth every 6 (six) hours as needed.   lidocaine (LIDODERM) 5 % Place 1 patch onto the skin daily. Remove & Discard patch within 12 hours or as directed by MD   Loratadine 10 MG CAPS Claritin   norgestrel-ethinyl estradiol (LOW-OGESTREL) 0.3-30 MG-MCG tablet Take 1 tablet by mouth daily.   valACYclovir (VALTREX) 500 MG tablet Take 500 mg by mouth See admin instructions. 2 tabs twice daily with cold sore,1 tab for suppression     Allergies:   Clarithromycin, Cyproheptadine, Amoxicillin-pot clavulanate, Erythromycin, and Penicillin g   Social History   Tobacco Use   Smoking status: Never   Smokeless tobacco: Never  Substance Use Topics   Alcohol use: Never   Drug use: Never     Family History: The patient's family history is noted in the HPI.  ROS:   Please see the history of present illness.    (+) Palpitations (+) Exertional shortness of breath (+) Exertional chest tightness All other systems reviewed and are negative.  EKGs/Labs/Other Studies Reviewed:    The following studies were reviewed today:  Left LE Venous DVT 07/18/2020:   IMPRESSION: No femoropopliteal DVT nor evidence of DVT within the visualized calf veins.   If clinical symptoms are inconsistent or if there are persistent or worsening symptoms, further imaging (possibly involving the iliac veins) may  be warranted.  EKG:   11/02/2020: Sinus rhythm. Rate 79 bpm.  Imaging studies that I have independently reviewed today: n/a  Recent Labs: No results found for requested labs within last 8760 hours.   Recent Lipid Panel No results found for: CHOL, TRIG, HDL, CHOLHDL, VLDL, LDLCALC, LDLDIRECT  Physical Exam:    VS:  BP 95/68   Ht 5\' 5"  (1.651 m)   Wt 195 lb 12.8 oz (88.8 kg)   SpO2 98%    BMI 32.58 kg/m     Wt Readings from Last 5 Encounters:  11/02/20 195 lb 12.8 oz (88.8 kg)  07/18/20 187 lb (84.8 kg)  06/02/18 184 lb 9.6 oz (83.7 kg)  10/24/17 194 lb (88 kg)    Constitutional: No acute distress Eyes: sclera non-icteric, normal conjunctiva and lids ENMT: normal dentition, moist mucous membranes Cardiovascular: regular rhythm, normal rate, no murmur. S1 and S2 normal. No jugular venous distention.  Respiratory: clear to auscultation bilaterally GI : normal bowel sounds, soft and nontender. No distention.   MSK: extremities warm, well perfused. No edema.  NEURO: grossly nonfocal exam, moves all extremities. PSYCH: alert and oriented x 3, normal mood and affect.   ASSESSMENT:    1. Palpitations   2. Precordial pain    PLAN:    Palpitations  - Plan: EKG 12-Lead - ECHOCARDIOGRAM COMPLETE - LONG TERM MONITOR (3-14 DAYS) - will evaluate symptoms of palpitations, which have occurred historically as well with unremarkable evaluation. Will perform 3 day monitor, as well as echocardiogram for structural heart disease.   Precordial pain - Coronary CTA will be performed to exclude obstructive CAD. LDL is 97 from 02/28/20, if coronary disease detected, would consider initiation of statin for secondary prevention of CAD.  Total time of encounter: 60 minutes total time of encounter, including 30 minutes spent in face-to-face patient care on the date of this encounter. This time includes coordination of care and counseling regarding above mentioned problem list. Remainder of non-face-to-face time involved reviewing chart documents/testing relevant to the patient encounter and documentation in the medical record. I have independently reviewed documentation from referring provider.   03/01/20, MD, [facility] [place]  [facility]   Shared Decision Making/Informed Consent:       Medication Adjustments/Labs and Tests Ordered: Current medicines are reviewed at length  with the patient today.  Concerns regarding medicines are outlined above.   Orders Placed This Encounter  Procedures   CT CORONARY MORPH W/CTA COR W/SCORE W/CA W/CM &/OR WO/CM   Basic metabolic panel   LONG TERM MONITOR (3-14 DAYS)   EKG 12-Lead   ECHOCARDIOGRAM COMPLETE     Meds ordered this encounter  Medications   metoprolol tartrate (LOPRESSOR) 100 MG tablet    Sig: TAKE 1 TABLET 2 HR PRIOR TO CARDIAC PROCEDURE    Dispense:  1 tablet    Refill:  0     Patient Instructions  Medication Instructions:  Your Physician recommend you continue on your current medication as directed.    *If you need a refill on your cardiac medications before your next appointment, please call your pharmacy*   Lab Work: Today (bmet) If you have labs (blood work) drawn today and your tests are completely normal, you will receive your results only by: MyChart Message (if you have MyChart) OR A paper copy in the mail If you have any lab test that is abnormal or we need to change your treatment, we will call you to review the results.  Testing/Procedures: Your physician has requested that you have an echocardiogram. Echocardiography is a painless test that uses sound waves to create images of your heart. It provides your doctor with information about the size and shape of your heart and how well your heart's chambers and valves are working. This procedure takes approximately one hour. There are no restrictions for this procedure. [address]  3-day ZIO monitor  Cardiac CT Angiography (CTA), is a special type of CT scan that uses a computer to produce multi-dimensional views of major blood vessels throughout the body. In CT angiography, a contrast material is injected through an IV to help visualize the blood vessels [facility]  Follow-Up: At [facility], you and your health needs are our priority.  As part of our continuing mission to provide you with exceptional  heart care, we have created designated Provider Care Teams.  These Care Teams include your primary Cardiologist (physician) and Advanced Practice Providers (APPs -  Physician Assistants and Nurse Practitioners) who all work together to provide you with the care you need, when you need it.  We recommend signing up for the patient portal called "MyChart".  Sign up information is provided on this After Visit Summary.  MyChart is used to connect with patients for Virtual Visits (Telemedicine).  Patients are able to view lab/test results, encounter notes, upcoming appointments, etc.  Non-urgent messages can be sent to your provider as well.   To learn more about what you can do with MyChart, go to [email or web address].    Your next appointment:   2 month(s)  The format for your next appointment:   In Person  Provider:   Marjie Skiff or Azalee Course   Other Instructions  Your cardiac CT will be scheduled at one of the below locations:   [facility] [address] [place], [place] [address] [phone]   If scheduled at [facility], please arrive at the [facility] main entrance (entrance A) of [facility] 30 minutes prior to test start time. You can use the FREE valet parking offered at the main entrance (encouraged to control the heart rate for the test) Proceed to the [facility] Radiology Department (first floor) to check-in and test prep.  If scheduled at [facility], please arrive 15 mins early for check-in and test prep.  Please follow these instructions carefully (unless otherwise directed):   On the Night Before the Test: Be sure to Drink plenty of water. Do not consume any caffeinated/decaffeinated beverages or chocolate 12 hours prior to your test. Do not take any antihistamines 12 hours prior to your test.  On the Day of the Test: Drink plenty of water until 1 hour prior to the test. Do not eat any food 4 hours  prior to the test. You may take your regular medications prior to the test.  Take metoprolol (Lopressor) 100 mg two hours prior to test. FEMALES- please wear underwire-free bra if available, avoid dresses & tight clothing       After the Test: Drink plenty of water. After receiving IV contrast, you may experience a mild flushed feeling. This is normal. On occasion, you may experience a mild rash up to 24 hours after the test. This is not dangerous. If this occurs, you can take Benadryl 25 mg and increase your fluid intake. If you experience trouble breathing, this can be serious. If it is severe call 911 IMMEDIATELY. If it is mild, please  call our office. If you take any of these medications: Glipizide/Metformin, Avandament, Glucavance, please do not take 48 hours after completing test unless otherwise instructed.  Please allow 2-4 weeks for scheduling of routine cardiac CTs. Some insurance companies require a pre-authorization which may delay scheduling of this test.   For non-scheduling related questions, please contact the cardiac imaging nurse navigator should you have any questions/concerns: Rockwell Alexandria, Cardiac Imaging Nurse Navigator Larey Brick, Cardiac Imaging Nurse Navigator [facility] Direct Office Dial: [phone]   For scheduling needs, including cancellations and rescheduling, please call [place], [phone].     I,Mathew Stumpf,acting as a Neurosurgeon for Parke Poisson, MD.,have documented all relevant documentation on the behalf of Parke Poisson, MD,as directed by  Parke Poisson, MD while in the presence of Parke Poisson, MD.  I, Parke Poisson, MD, have reviewed all documentation for this visit. The documentation on today's date of service for the exam, diagnosis, procedures, and orders are all accurate and complete.

## 2020-11-02 ENCOUNTER — Ambulatory Visit (INDEPENDENT_AMBULATORY_CARE_PROVIDER_SITE_OTHER): Payer: Managed Care, Other (non HMO) | Admitting: Internal Medicine

## 2020-11-02 ENCOUNTER — Other Ambulatory Visit: Payer: Self-pay

## 2020-11-02 ENCOUNTER — Ambulatory Visit (INDEPENDENT_AMBULATORY_CARE_PROVIDER_SITE_OTHER): Payer: Managed Care, Other (non HMO)

## 2020-11-02 VITALS — BP 95/68 | Ht 65.0 in | Wt 195.8 lb

## 2020-11-02 DIAGNOSIS — R002 Palpitations: Secondary | ICD-10-CM

## 2020-11-02 DIAGNOSIS — R072 Precordial pain: Secondary | ICD-10-CM

## 2020-11-02 LAB — BASIC METABOLIC PANEL
BUN/Creatinine Ratio: 14 (ref 9–23)
BUN: 12 mg/dL (ref 6–24)
CO2: 21 mmol/L (ref 20–29)
Calcium: 9.5 mg/dL (ref 8.7–10.2)
Chloride: 101 mmol/L (ref 96–106)
Creatinine, Ser: 0.87 mg/dL (ref 0.57–1.00)
Glucose: 81 mg/dL (ref 70–99)
Potassium: 4.8 mmol/L (ref 3.5–5.2)
Sodium: 137 mmol/L (ref 134–144)
eGFR: 81 mL/min/{1.73_m2} (ref >59)

## 2020-11-02 MED ORDER — METOPROLOL TARTRATE 100 MG PO TABS: ORAL_TABLET | ORAL | 0 refills | Status: DC

## 2020-11-02 NOTE — Patient Instructions (Signed)
Medication Instructions:  Your Physician recommend you continue on your current medication as directed.    *If you need a refill on your cardiac medications before your next appointment, please call your pharmacy*   Lab Work: Today (bmet) If you have labs (blood work) drawn today and your tests are completely normal, you will receive your results only by: MyChart Message (if you have MyChart) OR A paper copy in the mail If you have any lab test that is abnormal or we need to change your treatment, we will call you to review the results.   Testing/Procedures: Your physician has requested that you have an echocardiogram. Echocardiography is a painless test that uses sound waves to create images of your heart. It provides your doctor with information about the size and shape of your heart and how well your heart's chambers and valves are working. This procedure takes approximately one hour. There are no restrictions for this procedure. [address]  3-day ZIO monitor  Cardiac CT Angiography (CTA), is a special type of CT scan that uses a computer to produce multi-dimensional views of major blood vessels throughout the body. In CT angiography, a contrast material is injected through an IV to help visualize the blood vessels [facility]  Follow-Up: At [facility], you and your health needs are our priority.  As part of our continuing mission to provide you with exceptional heart care, we have created designated Provider Care Teams.  These Care Teams include your primary Cardiologist (physician) and Advanced Practice Providers (APPs -  Physician Assistants and Nurse Practitioners) who all work together to provide you with the care you need, when you need it.  We recommend signing up for the patient portal called "MyChart".  Sign up information is provided on this After Visit Summary.  MyChart is used to connect with patients for Virtual Visits (Telemedicine).   Patients are able to view lab/test results, encounter notes, upcoming appointments, etc.  Non-urgent messages can be sent to your provider as well.   To learn more about what you can do with MyChart, go to [email or web address].    Your next appointment:   2 month(s)  The format for your next appointment:   In Person  Provider:   Marjie Skiff or Azalee Course   Other Instructions  Your cardiac CT will be scheduled at one of the below locations:   [facility] [address] [place], [place] [address] [phone]   If scheduled at [facility], please arrive at the [facility] main entrance (entrance A) of [facility] 30 minutes prior to test start time. You can use the FREE valet parking offered at the main entrance (encouraged to control the heart rate for the test) Proceed to the [facility] Radiology Department (first floor) to check-in and test prep.  If scheduled at [facility], please arrive 15 mins early for check-in and test prep.  Please follow these instructions carefully (unless otherwise directed):   On the Night Before the Test: Be sure to Drink plenty of water. Do not consume any caffeinated/decaffeinated beverages or chocolate 12 hours prior to your test. Do not take any antihistamines 12 hours prior to your test.  On the Day of the Test: Drink plenty of water until 1 hour prior to the test. Do not eat any food 4 hours prior to the test. You may take your regular medications prior to the test.  Take metoprolol (Lopressor) 100  mg two hours prior to test. FEMALES- please wear underwire-free bra if available, avoid dresses & tight clothing       After the Test: Drink plenty of water. After receiving IV contrast, you may experience a mild flushed feeling. This is normal. On occasion, you may experience a mild rash up to 24 hours after the test. This is not dangerous. If this occurs, you can take Benadryl  25 mg and increase your fluid intake. If you experience trouble breathing, this can be serious. If it is severe call 911 IMMEDIATELY. If it is mild, please call our office. If you take any of these medications: Glipizide/Metformin, Avandament, Glucavance, please do not take 48 hours after completing test unless otherwise instructed.  Please allow 2-4 weeks for scheduling of routine cardiac CTs. Some insurance companies require a pre-authorization which may delay scheduling of this test.   For non-scheduling related questions, please contact the cardiac imaging nurse navigator should you have any questions/concerns: Rockwell Alexandria, Cardiac Imaging Nurse Navigator Larey Brick, Cardiac Imaging Nurse Navigator [place] [facility] Direct Office Dial: [phone]   For scheduling needs, including cancellations and rescheduling, please call [place], [phone].

## 2020-11-02 NOTE — Progress Notes (Unsigned)
Patient enrolled for [employer] to mail a 3 day ZIO XT monitor to her address on file. 

## 2020-11-05 DIAGNOSIS — R002 Palpitations: Secondary | ICD-10-CM | POA: Diagnosis not present

## 2020-11-05 DIAGNOSIS — R072 Precordial pain: Secondary | ICD-10-CM | POA: Diagnosis not present

## 2020-11-10 ENCOUNTER — Telehealth (HOSPITAL_COMMUNITY): Payer: Self-pay | Admitting: *Deleted

## 2020-11-10 NOTE — Telephone Encounter (Signed)
Reaching out to patient to offer assistance regarding upcoming cardiac imaging study; pt verbalizes understanding of appt date/time, parking situation and where to check in, pre-test NPO status and medications ordered, and verified current allergies; name and call back number provided for further questions should they arise ° °Randell Teare RN Navigator Cardiac Imaging °[facility] °[phone] office °[phone] cell  ° °Patient to take 100mg metoprolol tartrate two hours prior to cardiac CT scan. °

## 2020-11-13 ENCOUNTER — Ambulatory Visit (HOSPITAL_COMMUNITY)
Admission: RE | Admit: 2020-11-13 | Discharge: 2020-11-13 | Disposition: A | Discharge status: Home / Self Care | Payer: Managed Care, Other (non HMO) | Source: Ambulatory Visit | Attending: Internal Medicine | Admitting: Internal Medicine

## 2020-11-13 ENCOUNTER — Other Ambulatory Visit: Payer: Self-pay

## 2020-11-13 DIAGNOSIS — R002 Palpitations: Secondary | ICD-10-CM | POA: Diagnosis present

## 2020-11-13 DIAGNOSIS — R072 Precordial pain: Secondary | ICD-10-CM | POA: Insufficient documentation

## 2020-11-13 MED ORDER — NITROGLYCERIN 0.4 MG SL SUBL
SUBLINGUAL_TABLET | SUBLINGUAL | Status: AC
  Filled 2020-11-13: qty 2

## 2020-11-13 MED ORDER — IOHEXOL 350 MG/ML SOLN
100.0000 mL | Freq: Once | INTRAVENOUS | Status: AC | PRN
  Administered 2020-11-13: 100 mL via INTRAVENOUS

## 2020-11-13 MED ORDER — NITROGLYCERIN 0.4 MG SL SUBL
0.8000 mg | SUBLINGUAL_TABLET | Freq: Once | SUBLINGUAL | Status: AC
  Administered 2020-11-13: 0.8 mg via SUBLINGUAL

## 2020-11-27 ENCOUNTER — Ambulatory Visit (HOSPITAL_COMMUNITY): Payer: Managed Care, Other (non HMO) | Attending: Internal Medicine

## 2020-11-27 ENCOUNTER — Other Ambulatory Visit: Payer: Self-pay

## 2020-11-27 DIAGNOSIS — R072 Precordial pain: Secondary | ICD-10-CM | POA: Insufficient documentation

## 2020-11-27 DIAGNOSIS — R002 Palpitations: Secondary | ICD-10-CM | POA: Diagnosis present

## 2020-11-27 LAB — ECHOCARDIOGRAM COMPLETE
Area-P 1/2: 4.15 cm2
S' Lateral: 3 cm

## 2020-11-27 MED ORDER — PERFLUTREN LIPID MICROSPHERE
1.0000 mL | INTRAVENOUS | Status: AC | PRN
  Administered 2020-11-27: 2 mL via INTRAVENOUS

## 2020-12-21 ENCOUNTER — Other Ambulatory Visit: Payer: Self-pay | Admitting: *Deleted

## 2020-12-21 DIAGNOSIS — I77810 Thoracic aortic ectasia: Secondary | ICD-10-CM

## 2021-01-01 NOTE — Progress Notes (Signed)
Cardiology Office Note:    Date:  01/02/2021   ID:  Sandra Velasquez, DOB [birth year], MRN [id number]  PCP:  [facility] Providers Cardiologist:  Parke Poisson, MD     Referring MD: [facility]*   Chief complaint: follow-up palpitations  History of Present Illness:    Sandra Velasquez is a 50 y.o. female with a hx of palpitations, mild aortic dilatation, fatigue, iron deficiency anemia, and GERD.   She was last seen 11/02/20 by  Dr. Jacques Navy for evaluation of palpitations. She described initial occurrence of palpitations 9 to 10 years ago that were related to family stress issues and resolved once the stressors resolved.  More recently, she was having intermittent palpitations at least once daily for a few weeks with duration of 3 to 30 seconds at a time.  They were most noticeable at night when she was calm.  She also describes shortness of breath with activity associated with chest tightness.  She would feel difficulty taking a deep breath and feeling each palpitation. Her 3 day Zio monitor revealed occasional PVCs approximately 3.4% of the time. She elected not to start metoprolol.   Today, she is here alone. She reports that she had struggled with numbness of the left leg since January 2021. She has sciatica in that leg which limits her but she is working on increasing activity with low impact exercise and stretching. She thinks that her palpitations occur more frequently when she is stressed. She denies chest pain, shortness of breath, lower extremity edema, fatigue,melena, hematuria, diaphoresis, weakness, presyncope, syncope, orthopnea, and PND. She had recent lab work with Delrae Rend MD where she seeking treatment for weight loss. She will call to get results sent to [place].    Past Medical History:  Diagnosis Date   Palpitations    Sciatica of left side     Past Surgical History:  Procedure Laterality Date   CESAREAN SECTION      2001, 2003, 2007    Current Medications: Current Meds  Medication Sig   cetirizine (ZYRTEC) 10 MG tablet 1 tablet   Cholecalciferol (VITAMIN D) 10 MCG/ML LIQD Vitamin D   Diethylpropion HCl CR 75 MG TB24 Take 1 tablet by mouth every morning.   EPINEPHrine 0.3 mg/0.3 mL IJ SOAJ injection epinephrine 0.3 mg/0.3 mL injection, auto-injector  INJECT INTO THIGH AS INSTRUCTED AS NEEDED FOR ALLERGIC REACTION   loratadine (CLARITIN) 10 MG tablet    norgestrel-ethinyl estradiol (LOW-OGESTREL) 0.3-30 MG-MCG tablet 1 tablet   propranolol (INDERAL) 10 MG tablet Take 1 tablet (10 mg total) by mouth 4 (four) times daily as needed (palpitations).   vitamin B-12 (CYANOCOBALAMIN) 100 MCG tablet See admin instructions.     Allergies:   Clarithromycin, Cyproheptadine, Amoxicillin-pot clavulanate, Erythromycin, and Penicillin g   Social History   Socioeconomic History   Marital status: Married    Spouse name: Not on file   Number of children: Not on file   Years of education: Not on file   Highest education level: Not on file  Occupational History   Not on file  Tobacco Use   Smoking status: Never   Smokeless tobacco: Never  Substance and Sexual Activity   Alcohol use: Never   Drug use: Never   Sexual activity: Never  Other Topics Concern   Not on file  Social History Narrative   Not on file   Social Determinants of Health   Financial Resource Strain: Not on  file  Food Insecurity: Not on file  Transportation Needs: Not on file  Physical Activity: Not on file  Stress: Not on file  Social Connections: Not on file     Family History: The patient's family history includes CAD in her father.  ROS:   Please see the history of present illness.  All other systems reviewed and are negative.  Labs/Other Studies Reviewed:    The following studies were reviewed today:  Echo 11/27/20  Left Ventricle: Left ventricular ejection fraction, by estimation, is 55  to 60%. The left ventricle  has normal function. The left ventricle has no  regional wall motion abnormalities. Definity contrast agent was given IV  to delineate the left ventricular endocardial borders. The left ventricular internal cavity size was normal in size. There is no left ventricular hypertrophy. Left ventricular diastolic parameters are consistent with Grade I diastolic dysfunction (impaired relaxation). Normal left  ventricular filling pressure.  Right Ventricle: The right ventricular size is normal. No increase in  right ventricular wall thickness. Right ventricular systolic function is  normal. Tricuspid regurgitation signal is inadequate for assessing PA  pressure.  Left Atrium: Left atrial size was normal in size.  Right Atrium: Right atrial size was normal in size.  Pericardium: There is no evidence of pericardial effusion.  Mitral Valve: The mitral valve is grossly normal. No evidence of mitral  valve regurgitation.  Tricuspid Valve: The tricuspid valve is grossly normal. Tricuspid valve  regurgitation is trivial.  Aortic Valve: The aortic valve is tricuspid. Aortic valve regurgitation is  not visualized.  Pulmonic Valve: The pulmonic valve was not well visualized. Pulmonic valve  regurgitation is not visualized.  Aorta: Aortic dilatation noted. There is mild dilatation of the aortic  root, measuring 40 mm.  Venous: The inferior vena cava is normal in size with less than 50%  respiratory variability, suggesting right atrial pressure of 8 mmHg.  IAS/Shunts: No atrial level shunt detected by color flow Doppler.   Cardiac monitor 12/22  Occasional PVCs correlating with patient symptoms. Patient had a min HR of 60 bpm, max HR of 127 bpm, and avg HR of 89 bpm. Predominant underlying rhythm was Sinus Rhythm. Isolated SVEs were rare (<1.0%), SVE Triplets were rare (<1.0%), and no SVE Couplets were present. Isolated VEs were occasional  (3.4%, 13161), VE Couplets were rare (<1.0%, 1), and no VE Triplets  were present. Ventricular Bigeminy and Trigeminy were present.   Cor CT 11/13/20  IMPRESSION: 1. Coronary artery calcium score 0 Agatston units, suggesting low risk for future cardiac events.  2.  No significant coronary disease noted.    Recent Labs: 11/02/2020: BUN 12; Creatinine, Ser 0.87; Potassium 4.8; Sodium 137  Recent Lipid Panel Faxed from [facility] MD [facility] 11/30/20  LDL 118, HDL 44, Trigs 96 K+ 4.4, Na+ 138, creat 0.81, ferritin 236, TSH 1.524, Hgb 13.1, plt 298   Physical Exam:    VS:  BP 120/72    Pulse 93    Ht 5\' 5"  (1.651 m)    Wt 190 lb (86.2 kg)    SpO2 99%    BMI 31.62 kg/m     Wt Readings from Last 3 Encounters:  01/02/21 190 lb (86.2 kg)  11/02/20 195 lb 12.8 oz (88.8 kg)  07/18/20 187 lb (84.8 kg)     GEN:  Well nourished, well developed in no acute distress HEENT: Normal NECK: No JVD; No carotid bruits LYMPHATICS: No lymphadenopathy CARDIAC: RRR, no  murmurs, rubs, gallops RESPIRATORY:  Clear to auscultation without rales, wheezing or rhonchi  ABDOMEN: Soft, non-tender, non-distended MUSCULOSKELETAL:  No edema; No deformity  SKIN: Warm and dry NEUROLOGIC:  Alert and oriented x 3 PSYCHIATRIC:  Normal affect   EKG:  EKG is not ordered today.    Diagnoses:    1. Palpitations   2. Hyperlipidemia, unspecified hyperlipidemia type   3. Dilated aortic root (HCC)    Assessment and Plan:     Palpitations: She continues to have occasional palpitations that are not limiting and tend to occur more often when she feels stressed. Reviewed monitor results in detail with her to show her that each time she self-triggered the monitor a PVC had just occurred. Will try propranolol 10 mg up to 4 times per day as needed. She recently started an appetite suppressant. Encouraged her to monitor for increased BP or palpitations while on appetite suppressant.    Dilated aortic root: Echo 11/27/20 revealed mild dilatation of  aortic root measuring 40 mm. Education provided. BP is stable today. Encouraged continued good BP control. Have ordered repeat echo in 1 year.   Hyperlipidemia:  Coronary calcium score 0 10/22. Had recent lab work at [employer] control facility that she recently started. LDL 118. Encouraged heart healthy diet and moderate intensity exercise 150 minutes per week.     Disposition: 12 months with Dr. Jacques Navy  Medication Adjustments/Labs and Tests Ordered: Current medicines are reviewed at length with the patient today.  Concerns regarding medicines are outlined above.  No orders of the defined types were placed in this encounter.  Meds ordered this encounter  Medications   propranolol (INDERAL) 10 MG tablet    Sig: Take 1 tablet (10 mg total) by mouth 4 (four) times daily as needed (palpitations).    Dispense:  90 tablet    Refill:  1    Patient Instructions  Medication Instructions:  START Propranolol (Inderal) 10 mg 4 times a day as needed for palpitations   *If you need a refill on your cardiac medications before your next appointment, please call your pharmacy*  Lab Work: NONE ordered at this time of appointment   If you have labs (blood work) drawn today and your tests are completely normal, you will receive your results only by: MyChart Message (if you have MyChart) OR A paper copy in the mail If you have any lab test that is abnormal or we need to change your treatment, we will call you to review the results.   Testing/Procedures: NONE ordered at this time of appointment   Follow-Up: At [facility], you and your health needs are our priority.  As part of our continuing mission to provide you with exceptional heart care, we have created designated Provider Care Teams.  These Care Teams include your primary Cardiologist (physician) and Advanced Practice Providers (APPs -  Physician Assistants and Nurse Practitioners) who all work together to provide you with the care you need,  when you need it.  Your next appointment:   1 year(s)  The format for your next appointment:   In Person  Provider:   Parke Poisson, MD    Other Instructions MONITOR blood pressure at home. If Systolic (top number) is persistently greater than 130 give our office a call    Signed, Levi Aland, NP  01/02/2021 10:20 AM    [facility]

## 2021-01-02 ENCOUNTER — Other Ambulatory Visit: Payer: Self-pay

## 2021-01-02 ENCOUNTER — Encounter: Payer: Self-pay | Admitting: Physician Assistant

## 2021-01-02 ENCOUNTER — Ambulatory Visit (INDEPENDENT_AMBULATORY_CARE_PROVIDER_SITE_OTHER): Payer: Managed Care, Other (non HMO) | Admitting: Nurse Practitioner

## 2021-01-02 ENCOUNTER — Encounter: Payer: Self-pay | Admitting: Internal Medicine

## 2021-01-02 VITALS — BP 120/72 | HR 93 | Ht 65.0 in | Wt 190.0 lb

## 2021-01-02 DIAGNOSIS — E785 Hyperlipidemia, unspecified: Secondary | ICD-10-CM

## 2021-01-02 DIAGNOSIS — R002 Palpitations: Secondary | ICD-10-CM

## 2021-01-02 DIAGNOSIS — I77810 Thoracic aortic ectasia: Secondary | ICD-10-CM

## 2021-01-02 MED ORDER — PROPRANOLOL HCL 10 MG PO TABS: 10.0000 mg | ORAL_TABLET | Freq: Four times a day (QID) | ORAL | 1 refills | Status: AC | PRN

## 2021-01-02 NOTE — Patient Instructions (Addendum)
Medication Instructions:  START Propranolol (Inderal) 10 mg 4 times a day as needed for palpitations   *If you need a refill on your cardiac medications before your next appointment, please call your pharmacy*  Lab Work: NONE ordered at this time of appointment   If you have labs (blood work) drawn today and your tests are completely normal, you will receive your results only by: MyChart Message (if you have MyChart) OR A paper copy in the mail If you have any lab test that is abnormal or we need to change your treatment, we will call you to review the results.   Testing/Procedures: NONE ordered at this time of appointment   Follow-Up: At [facility], you and your health needs are our priority.  As part of our continuing mission to provide you with exceptional heart care, we have created designated Provider Care Teams.  These Care Teams include your primary Cardiologist (physician) and Advanced Practice Providers (APPs -  Physician Assistants and Nurse Practitioners) who all work together to provide you with the care you need, when you need it.  Your next appointment:   1 year(s)  The format for your next appointment:   In Person  Provider:   Parke Poisson, MD    Other Instructions MONITOR blood pressure at home. If Systolic (top number) is persistently greater than 130 give our office a call

## 2021-10-01 ENCOUNTER — Telehealth: Payer: Self-pay | Admitting: Internal Medicine

## 2021-10-01 NOTE — Telephone Encounter (Signed)
Patient c/o Palpitations:  High priority if patient c/o lightheadedness, shortness of breath, or chest pain  How long have you had palpitations/irregular HR/ Afib? Are you having the symptoms now? Yes   Are you currently experiencing lightheadedness, SOB or CP? no  Do you have a history of afib (atrial fibrillation) or irregular heart rhythm? no  Have you checked your BP or HR? (document readings if available): no  Are you experiencing any other symptoms? no  

## 2021-10-01 NOTE — Telephone Encounter (Signed)
Called patient, she states that for the past 3 weeks she has been having increased palpitations. She is unsure if this is because she had back surgery last year and has started becoming more active again with walking, but she would like to make Dr.Acharya aware. She denies any other symptoms SOB, dizziness, lightheadedness. She is drinking more water and cutting back on caffeine. I did ask if she was taking the propranolol 10 mg 4 times daily as needed for palpations given by Sharyn Lull, NP at visit in December 2022. Patient states she was not taking these, forgotten that she had these to take. We did discuss trying this option to see if it improved her palpations. She will monitor vitals at home and call with any concerns. She will start with one tablet to see how she does. I did advise I would route to MD to review or any further recommendations.   Patient is also due for her 1 year follow up (due in December) I did get this scheduled for opening January 16th, patient has upcoming repeat ECHO in November.   Patient verbalized understanding.

## 2021-10-22 NOTE — Telephone Encounter (Signed)
Returned call to patient and scheduled her to see Dr. Margaretann Loveless right after Echo is completed on 11/15 at 2:40PM.   Patient also wanted to make Dr. Margaretann Loveless aware she is now taking [facility]- will add to chart with dosage when patient comes for visit 11/15. Advised patient I would forward to MD to make aware.   Advised patient to call back to office with any issues, questions, or concerns. Patient verbalized understanding.

## 2021-11-27 ENCOUNTER — Ambulatory Visit: Payer: Managed Care, Other (non HMO) | Attending: Internal Medicine

## 2021-11-27 DIAGNOSIS — I77810 Thoracic aortic ectasia: Secondary | ICD-10-CM | POA: Diagnosis not present

## 2021-11-27 DIAGNOSIS — E785 Hyperlipidemia, unspecified: Secondary | ICD-10-CM | POA: Insufficient documentation

## 2021-11-27 DIAGNOSIS — R002 Palpitations: Secondary | ICD-10-CM | POA: Diagnosis present

## 2021-11-27 LAB — ECHOCARDIOGRAM COMPLETE
Area-P 1/2: 3.53 cm2
S' Lateral: 2.6 cm

## 2021-11-27 MED ORDER — PERFLUTREN LIPID MICROSPHERE
1.0000 mL | INTRAVENOUS | Status: AC | PRN
  Administered 2021-11-27: 2 mL via INTRAVENOUS

## 2021-11-28 ENCOUNTER — Encounter: Payer: Self-pay | Admitting: Internal Medicine

## 2021-11-28 ENCOUNTER — Ambulatory Visit (INDEPENDENT_AMBULATORY_CARE_PROVIDER_SITE_OTHER): Payer: Managed Care, Other (non HMO) | Admitting: Internal Medicine

## 2021-11-28 VITALS — BP 108/68 | HR 85 | Ht 65.5 in | Wt 183.0 lb

## 2021-11-28 DIAGNOSIS — E785 Hyperlipidemia, unspecified: Secondary | ICD-10-CM | POA: Diagnosis not present

## 2021-11-28 DIAGNOSIS — I77819 Aortic ectasia, unspecified site: Secondary | ICD-10-CM

## 2021-11-28 DIAGNOSIS — I77810 Thoracic aortic ectasia: Secondary | ICD-10-CM

## 2021-11-28 DIAGNOSIS — R002 Palpitations: Secondary | ICD-10-CM

## 2021-11-28 NOTE — Patient Instructions (Signed)
Medication Instructions:  No Changes In Medications at this time.  *If you need a refill on your cardiac medications before your next appointment, please call your pharmacy*  Lab Work: None Ordered At This Time.  If you have labs (blood work) drawn today and your tests are completely normal, you will receive your results only by: MyChart Message (if you have MyChart) OR A paper copy in the mail If you have any lab test that is abnormal or we need to change your treatment, we will call you to review the results.  Testing/Procedures: Your physician has requested that you have an echocardiogram IN ONE YEAR. Echocardiography is a painless test that uses sound waves to create images of your heart. It provides your doctor with information about the size and shape of your heart and how well your heart's chambers and valves are working. You may receive an ultrasound enhancing agent through an IV if needed to better visualize your heart during the echo.This procedure takes approximately one hour. There are no restrictions for this procedure. This will take place at the [address], [address].   Follow-Up: At  [facility], you and your health needs are our priority.  As part of our continuing mission to provide you with exceptional heart care, we have created designated Provider Care Teams.  These Care Teams include your primary Cardiologist (physician) and Advanced Practice Providers (APPs -  Physician Assistants and Nurse Practitioners) who all work together to provide you with the care you need, when you need it.  Your next appointment:   1 year(s)  The format for your next appointment:   In Person  Provider:   Gayatri A Acharya, MD            

## 2021-11-28 NOTE — Progress Notes (Signed)
Cardiology Office Note:    Date:  11/28/2021   ID:  Sandra Velasquez, DOB [birth year], MRN [id number]  PCP:  [facility]  Cardiologist:  Parke Poisson, MD  Electrophysiologist:  None   Referring MD: [facility]*   Chief Complaint/Reason for Referral: Palpitations  History of Present Illness:    Sandra Velasquez is a 51 y.o. female with a history of fatigue, iron deficiency/anemia, and GERD, here for f/u evaluation of palpitations and borderline dilation of ascending aorta.   Currently, she is enrolled in a medical weight loss program.  She has successfully been losing weight, but is also on an appetite suppressant which is likely a stimulant medication.  She notes that her palpitations continue and she occasionally takes propranolol.  We discussed that this may be iatrogenic palpitations and I would recommend discontinuation of the appetite suppressant.  Alternatively, she could increase her frequency of taking propranolol.  We also discussed alternative beta-blocker such as metoprolol and long-acting form which would be perhaps more helpful.  Propranolol was initially prescribed by my colleague Eligha Bridegroom NP.  Reviewed echocardiogram line by line, discussed that aorta is borderline dilated but not aneurysmal.  Recheck in 1 year.   History: From 7/5-7/13/2022 she was evaluated in the ED on multiple occasions for worsening symptoms of left-sided sciatica.  Initially, she experienced palpitations 9-10 years ago that she attributes to stress from family issues. Once stressors were removed the palpitations resolved until about one month ago. Lately her palpitations have been occurring intermittently but at least once daily for a few weeks. She notes some inconsistency with durations of 3 to 30 seconds at a time. They are most noticeable at night when she is more calm. She has not noticed any palpitations in the last couple days.  Also she has  shortness of breath with activity, associated with chest tightness. When this occurs, she is unable to take a deep breath. Of note, she endorses a history of exercise-induced asthma when playing sports in high school and college.  Father had CABG x4, stents, died at 51 yo. Mother has hypertension.  The patient denies dyspnea at rest, PND, orthopnea, or leg swelling. Denies cough, fever, chills. Denies nausea, vomiting. Denies syncope or presyncope. Denies dizziness or lightheadedness.   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  She does not believe she has ever been infected with COVID.   Past Medical History:  Diagnosis Date   Palpitations    Sciatica of left side     Past Surgical History:  Procedure Laterality Date   CESAREAN SECTION     2001, 2003, 2007    Current Medications: Current Meds  Medication Sig   cetirizine (ZYRTEC) 10 MG tablet 1 tablet   Cholecalciferol (VITAMIN D) 10 MCG/ML LIQD Vitamin D   Diethylpropion HCl CR 75 MG TB24 Take 1 tablet by mouth every morning.   EPINEPHrine 0.3 mg/0.3 mL IJ SOAJ injection epinephrine 0.3 mg/0.3 mL injection, auto-injector  INJECT INTO THIGH AS INSTRUCTED AS NEEDED FOR ALLERGIC REACTION   loratadine (CLARITIN) 10 MG tablet    norgestrel-ethinyl estradiol (LOW-OGESTREL) 0.3-30 MG-MCG tablet 1 tablet   propranolol (INDERAL) 10 MG tablet Take 1 tablet (10 mg total) by mouth 4 (four) times daily as needed (palpitations).   tirzepatide [facility]) 5 MG/0.5ML Pen Inject 5 mg into the skin once a week.   vitamin B-12 (CYANOCOBALAMIN) 100 MCG tablet See admin instructions.  Allergies:   Clarithromycin, Cyproheptadine, Amoxicillin-pot clavulanate, Erythromycin, and Penicillin g   Social History   Tobacco Use   Smoking status: Never   Smokeless tobacco: Never  Substance Use Topics   Alcohol use: Never   Drug use: Never     Family History: The patient's family history is noted in the  HPI.  ROS:   Please see the history of present illness.     All other systems reviewed and are negative.  EKGs/Labs/Other Studies Reviewed:    The following studies were reviewed today:  Left LE Venous DVT 07/18/2020:   IMPRESSION: No femoropopliteal DVT nor evidence of DVT within the visualized calf veins.   If clinical symptoms are inconsistent or if there are persistent or worsening symptoms, further imaging (possibly involving the iliac veins) may be warranted.  EKG:   11/28/21: NSR 11/02/2020: Sinus rhythm. Rate 79 bpm.  Imaging studies that I have independently reviewed today: n/a  Recent Labs: No results found for requested labs within last 365 days.   Recent Lipid Panel No results found for: "CHOL", "TRIG", "HDL", "CHOLHDL", "VLDL", "LDLCALC", "LDLDIRECT"  Physical Exam:    VS:  BP 108/68   Pulse 85   Ht 5' 5.5" (1.664 m)   Wt 183 lb (83 kg)   SpO2 98%   BMI 29.99 kg/m     Wt Readings from Last 5 Encounters:  11/28/21 183 lb (83 kg)  01/02/21 190 lb (86.2 kg)  11/02/20 195 lb 12.8 oz (88.8 kg)  07/18/20 187 lb (84.8 kg)  06/02/18 184 lb 9.6 oz (83.7 kg)    Constitutional: No acute distress Eyes: sclera non-icteric, normal conjunctiva and lids ENMT: normal dentition, moist mucous membranes Cardiovascular: regular rhythm, normal rate, no murmur. S1 and S2 normal. No jugular venous distention.  Respiratory: clear to auscultation bilaterally GI : normal bowel sounds, soft and nontender. No distention.   MSK: extremities warm, well perfused. No edema.  NEURO: grossly nonfocal exam, moves all extremities. PSYCH: alert and oriented x 3, normal mood and affect.   ASSESSMENT:    1. Dyslipidemia, goal LDL below 70   2. Dilation of aorta (HCC)   3. Palpitations     PLAN:    Palpitations  - Plan: EKG 12-Lead -Possibly iatrogenic from appetite suppressant likely a stimulant medication.  Would recommend discontinuation of any stimulant medications.  She  will review what she is taking at home, and determine whether she would like to discontinue this medication or uptitrate her propranolol.  Consider metoprolol succinate 25 mg daily for more long-acting preventive therapy.  Precordial pain Dyslipidemia -Normal coronary CTA in October 2022 with 0 calcium score.  Recommend continued aggressive diet lifestyle modifications, she will have labs with her primary care provider upcoming.  Please send these for our review if needed.  Dilation of the aorta, borderline -Not aneurysmal but does measure 40 mm which is dilated for age and BSA.  Recommend repeat echo in 1 year, this has been stable since prior evaluations.  I personally reviewed coronary CTA which demonstrates the same.  Total time of encounter: 30 minutes total time of encounter, including 20 minutes spent in face-to-face patient care on the date of this encounter. This time includes coordination of care and counseling regarding above mentioned problem list. Remainder of non-face-to-face time involved reviewing chart documents/testing relevant to the patient encounter and documentation in the medical record. I have independently reviewed documentation from referring provider.   Weston Brass, MD, [facility] [place]  [facility]  HeartCare    Shared Decision Making/Informed Consent:       Medication Adjustments/Labs and Tests Ordered: Current medicines are reviewed at length with the patient today.  Concerns regarding medicines are outlined above.   Orders Placed This Encounter  Procedures   EKG 12-Lead   ECHOCARDIOGRAM COMPLETE     No orders of the defined types were placed in this encounter.    Patient Instructions  Medication Instructions:  No Changes In Medications at this time.  *If you need a refill on your cardiac medications before your next appointment, please call your pharmacy*  Lab Work: None Ordered At This Time.  If you have labs (blood work) drawn today and your tests  are completely normal, you will receive your results only by: MyChart Message (if you have MyChart) OR A paper copy in the mail If you have any lab test that is abnormal or we need to change your treatment, we will call you to review the results.  Testing/Procedures: Your physician has requested that you have an echocardiogram IN ONE YEAR. Echocardiography is a painless test that uses sound waves to create images of your heart. It provides your doctor with information about the size and shape of your heart and how well your heart's chambers and valves are working. You may receive an ultrasound enhancing agent through an IV if needed to better visualize your heart during the echo.This procedure takes approximately one hour. There are no restrictions for this procedure. This will take place at the [address], [address].   Follow-Up: At [facility], you and your health needs are our priority.  As part of our continuing mission to provide you with exceptional heart care, we have created designated Provider Care Teams.  These Care Teams include your primary Cardiologist (physician) and Advanced Practice Providers (APPs -  Physician Assistants and Nurse Practitioners) who all work together to provide you with the care you need, when you need it.  Your next appointment:   1 year(s)   The format for your next appointment:   In Person  Provider:   Parke Poisson, MD

## 2021-11-30 ENCOUNTER — Other Ambulatory Visit (HOSPITAL_COMMUNITY): Payer: Managed Care, Other (non HMO)

## 2022-01-29 ENCOUNTER — Ambulatory Visit: Payer: Managed Care, Other (non HMO) | Admitting: Internal Medicine

## 2022-04-04 IMAGING — MR MR FOOT*L* W/O CM
4 of 5 series · 25 of 40 positions shown · non-contrast
Comparison: None.

CLINICAL DATA: Soft tissue mass, foot, US/xray nondiagnostic

EXAM:
MRI OF THE LEFT FOOT WITHOUT CONTRAST
TECHNIQUE: Multiplanar, multisequence MR imaging of the left foot was
performed. No intravenous contrast was administered.

[Series 4: T1 · axial · 4.0mm · 0.38mm/px · z∈[-72,+54]mm · 8 of 31 slices shown (1 of 2)]
[im 1/31]
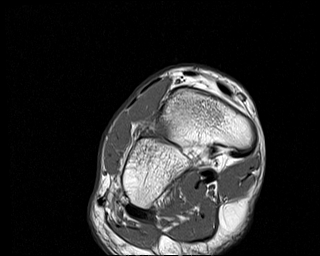
[im 4/31]
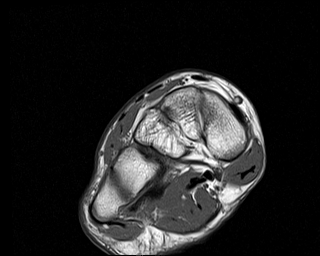
[im 11/31]
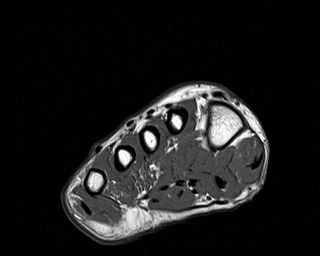
[im 14/31]
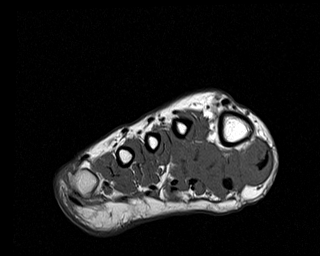
[im 17/31]
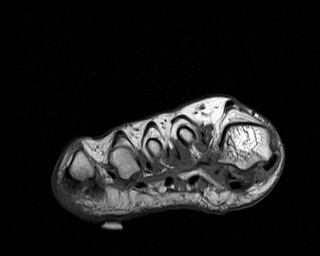
[im 21/31]
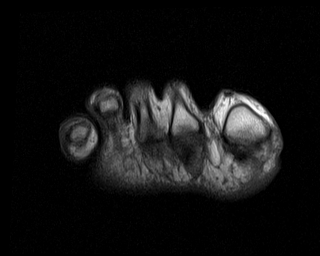
[im 27/31]
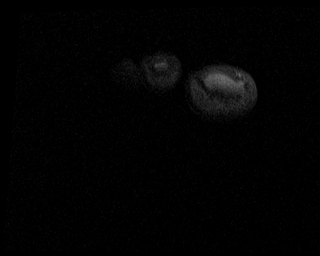
[im 31/31]
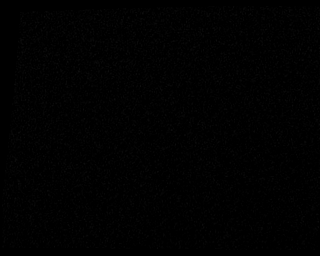

[Series 5: T2 fat-sat · axial · 4.0mm · 0.23mm/px · z∈[-79,+48]mm · 8 of 31 slices shown (1 of 2)]
[im 1/31]
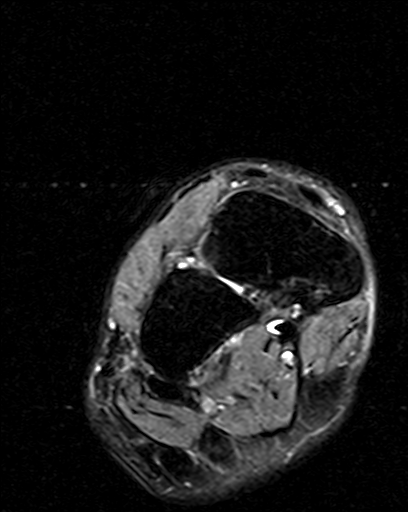
[im 4/31]
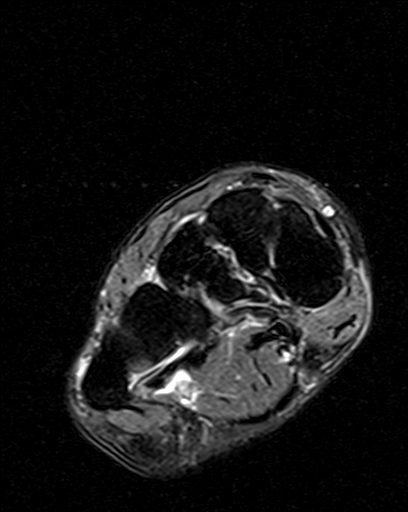
[im 11/31]
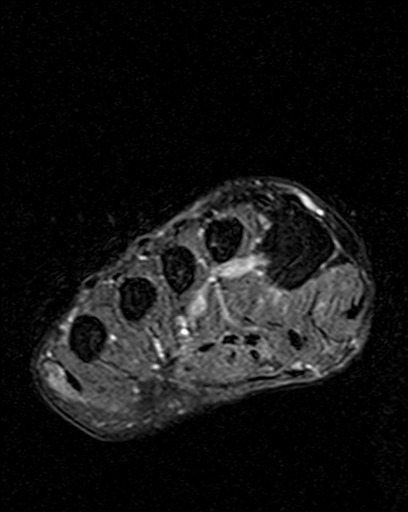
[im 14/31]
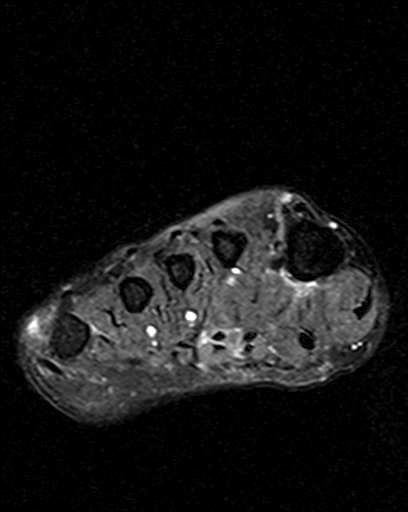
[im 17/31]
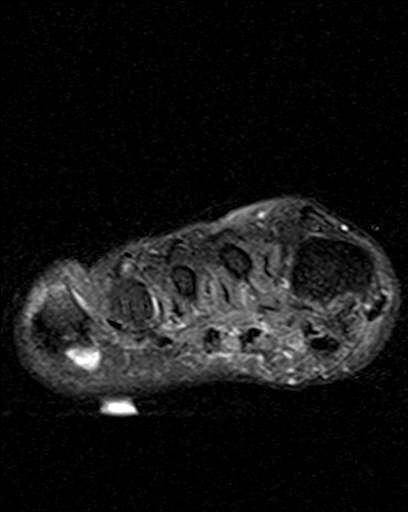
[im 21/31]
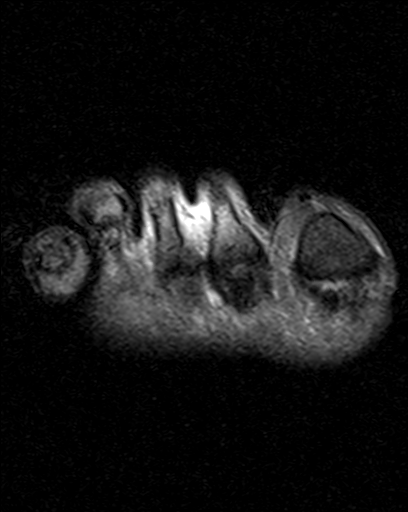
[im 27/31]
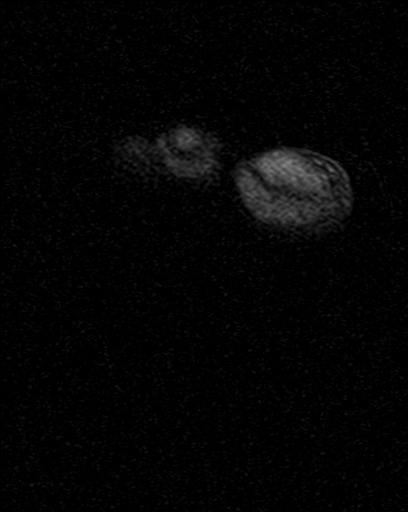
[im 31/31]
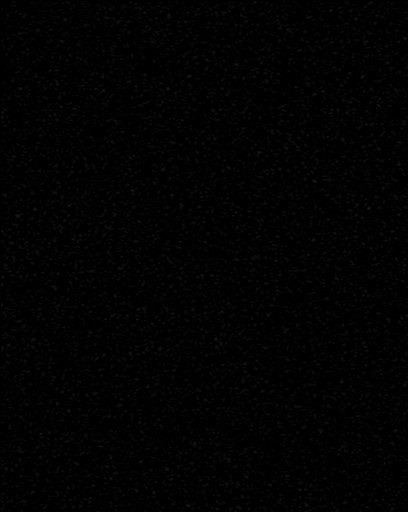

[Series 6: T2 fat-sat · coronal · 3.0mm · 0.35mm/px · 6 of 20 slices shown (2 of 2)]
[im 1/20]
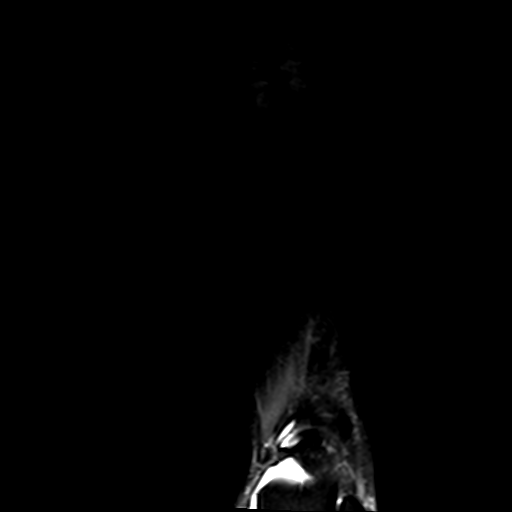
[im 4/20]
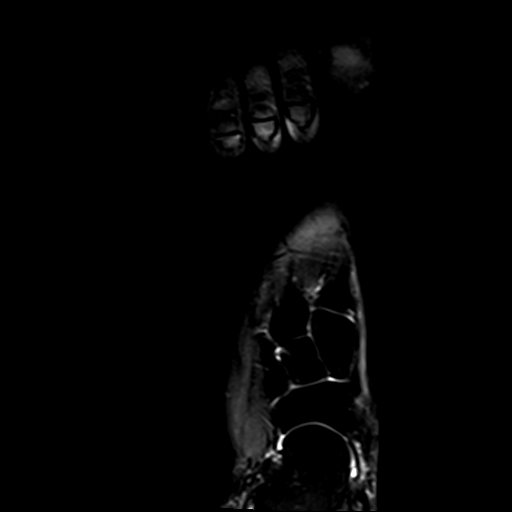
[im 8/20]
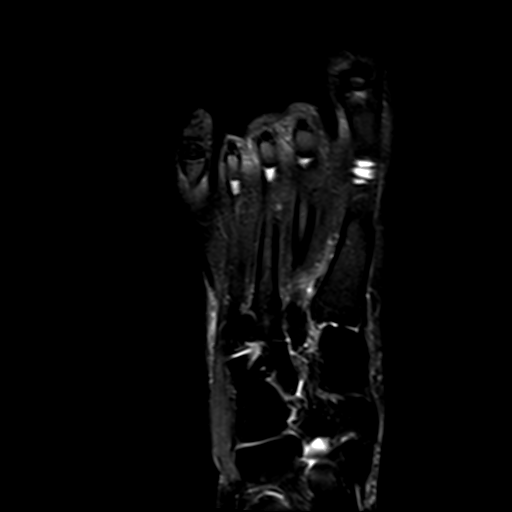
[im 12/20]
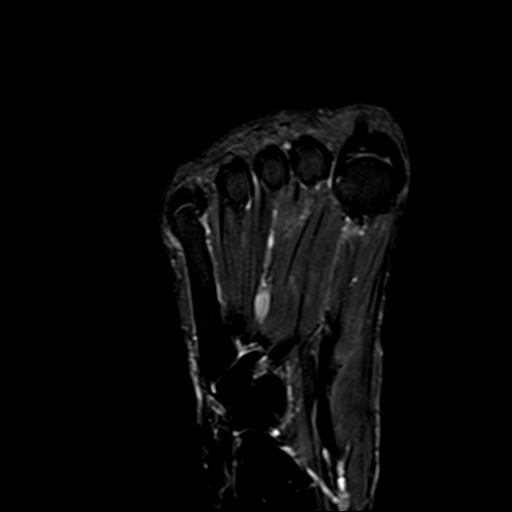
[im 16/20]
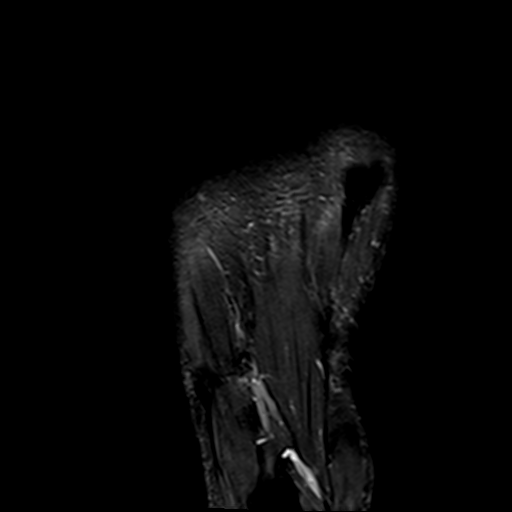
[im 20/20]
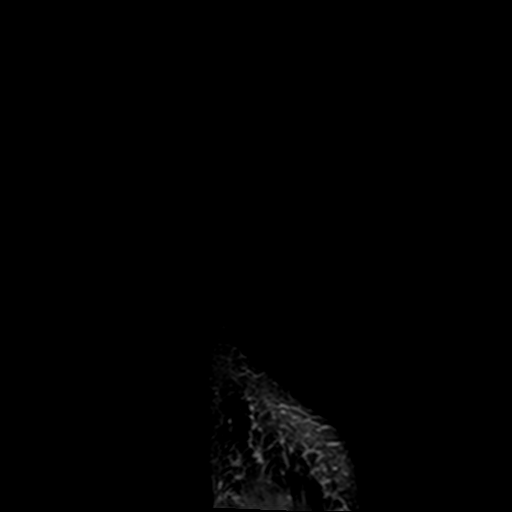

[Series 7: T1 · coronal · 3.0mm · 0.28mm/px · 3 of 20 slices shown (2 of 2)]
[im 4/20]
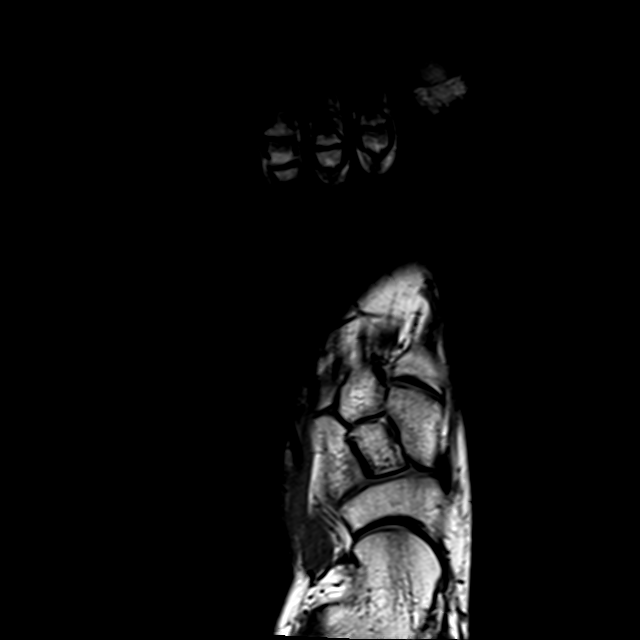
[im 12/20]
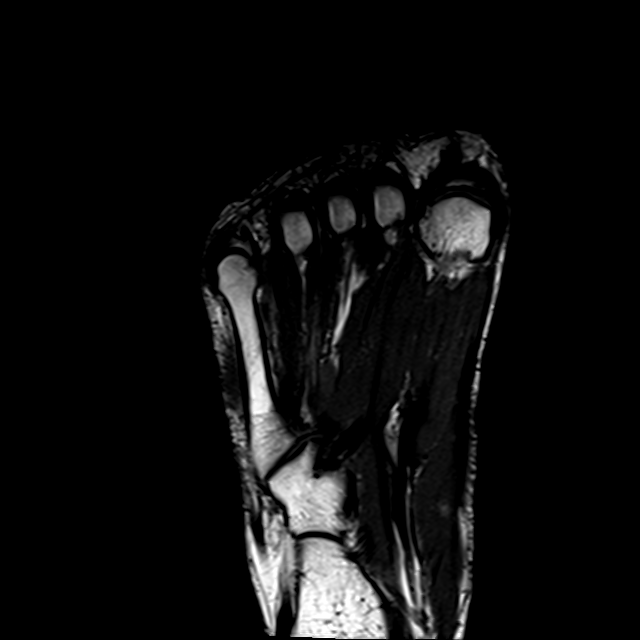
[im 20/20]
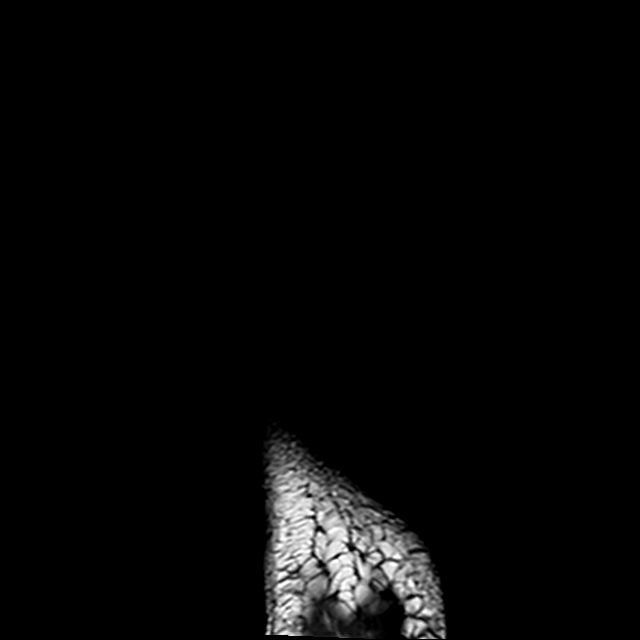

[25 of 40 positions shown; findings below may reference images not displayed]

FINDINGS: Bones/Joint/Cartilage

There is no acute osseous abnormality. The cortex is intact. There
is no significant marrow signal alteration.

Ligaments

Intact.

Muscles and Tendons

No muscle atrophy.  No muscle edema.

Soft tissues

Within the area of concern along the plantar lateral forefoot, there
is a T2 hyperintense/T1 hypointense lesion measuring 1.1 x 0.6 x
cm (axial T2 image 14, sagittal STIR image 5). This is adjacent to
the fifth MTP joint. There is no evidence of intermetatarsal
neuroma.
IMPRESSION: T2 hyperintense/T1 hypointense lesion measuring 1.1 x 0.6 x 1.1 cm
along the plantar lateral forefoot adjacent to the fifth MTP joint.
This is most likely a ganglion cyst, and less likely an adventitial
bursa.

## 2022-11-10 ENCOUNTER — Other Ambulatory Visit: Payer: Self-pay

## 2022-11-10 ENCOUNTER — Emergency Department (HOSPITAL_BASED_OUTPATIENT_CLINIC_OR_DEPARTMENT_OTHER)
Admission: EM | Admit: 2022-11-10 | Discharge: 2022-11-10 | Disposition: A | Discharge status: Home / Self Care | Payer: Managed Care, Other (non HMO) | Attending: Emergency Medicine | Admitting: Emergency Medicine

## 2022-11-10 ENCOUNTER — Emergency Department (HOSPITAL_BASED_OUTPATIENT_CLINIC_OR_DEPARTMENT_OTHER): Payer: Managed Care, Other (non HMO)

## 2022-11-10 DIAGNOSIS — R112 Nausea with vomiting, unspecified: Secondary | ICD-10-CM | POA: Insufficient documentation

## 2022-11-10 DIAGNOSIS — R197 Diarrhea, unspecified: Secondary | ICD-10-CM | POA: Insufficient documentation

## 2022-11-10 DIAGNOSIS — R1084 Generalized abdominal pain: Secondary | ICD-10-CM | POA: Insufficient documentation

## 2022-11-10 LAB — COMPREHENSIVE METABOLIC PANEL
ALT: 12 U/L (ref 0–44)
AST: 15 U/L (ref 15–41)
Albumin: 4.2 g/dL (ref 3.5–5.0)
Alkaline Phosphatase: 69 U/L (ref 38–126)
Anion gap: 5 (ref 5–15)
BUN: 14 mg/dL (ref 6–20)
CO2: 26 mmol/L (ref 22–32)
Calcium: 9.3 mg/dL (ref 8.9–10.3)
Chloride: 103 mmol/L (ref 98–111)
Creatinine, Ser: 0.77 mg/dL (ref 0.44–1.00)
GFR, Estimated: >60 mL/min (ref >60)
Glucose, Bld: 95 mg/dL (ref 70–99)
Potassium: 3.7 mmol/L (ref 3.5–5.1)
Sodium: 134 mmol/L — ABNORMAL LOW (ref 135–145)
Total Bilirubin: 1.1 mg/dL (ref 0.3–1.2)
Total Protein: 7.3 g/dL (ref 6.5–8.1)

## 2022-11-10 LAB — URINALYSIS, ROUTINE W REFLEX MICROSCOPIC
Bilirubin Urine: NEGATIVE
Glucose, UA: NEGATIVE mg/dL
Hgb urine dipstick: NEGATIVE
Ketones, ur: NEGATIVE mg/dL
Leukocytes,Ua: NEGATIVE
Nitrite: NEGATIVE
Protein, ur: NEGATIVE mg/dL
Specific Gravity, Urine: 1.006 (ref 1.005–1.030)
pH: 5.5 (ref 5.0–8.0)

## 2022-11-10 LAB — CBC WITH DIFFERENTIAL/PLATELET
Abs Immature Granulocytes: 0.04 10*3/uL (ref 0.00–0.07)
Basophils Absolute: 0.1 10*3/uL (ref 0.0–0.1)
Basophils Relative: 1 %
Eosinophils Absolute: 0.1 10*3/uL (ref 0.0–0.5)
Eosinophils Relative: 1 %
HCT: 41.2 % (ref 36.0–46.0)
Hemoglobin: 13.3 g/dL (ref 12.0–15.0)
Immature Granulocytes: 0 %
Lymphocytes Relative: 12 %
Lymphs Abs: 1.3 10*3/uL (ref 0.7–4.0)
MCH: 27.3 pg (ref 26.0–34.0)
MCHC: 32.3 g/dL (ref 30.0–36.0)
MCV: 84.4 fL (ref 80.0–100.0)
Monocytes Absolute: 1 10*3/uL (ref 0.1–1.0)
Monocytes Relative: 10 %
Neutro Abs: 8.1 10*3/uL — ABNORMAL HIGH (ref 1.7–7.7)
Neutrophils Relative %: 76 %
Platelets: 271 10*3/uL (ref 150–400)
RBC: 4.88 MIL/uL (ref 3.87–5.11)
RDW: 13.6 % (ref 11.5–15.5)
WBC: 10.7 10*3/uL — ABNORMAL HIGH (ref 4.0–10.5)
nRBC: 0 % (ref 0.0–0.2)

## 2022-11-10 LAB — PREGNANCY, URINE: Preg Test, Ur: NEGATIVE

## 2022-11-10 MED ORDER — ONDANSETRON 4 MG PO TBDP: 4.0000 mg | ORAL_TABLET | Freq: Three times a day (TID) | ORAL | 0 refills | Status: AC | PRN

## 2022-11-10 MED ORDER — ONDANSETRON HCL 4 MG/2ML IJ SOLN
4.0000 mg | Freq: Once | INTRAMUSCULAR | Status: AC
  Administered 2022-11-10: 4 mg via INTRAVENOUS
  Filled 2022-11-10: qty 2

## 2022-11-10 MED ORDER — IOHEXOL 300 MG/ML  SOLN
100.0000 mL | Freq: Once | INTRAMUSCULAR | Status: AC | PRN
  Administered 2022-11-10: 100 mL via INTRAVENOUS

## 2022-11-10 MED ORDER — SODIUM CHLORIDE 0.9 % IV BOLUS
1000.0000 mL | Freq: Once | INTRAVENOUS | Status: AC
  Administered 2022-11-10: 1000 mL via INTRAVENOUS

## 2022-11-10 NOTE — ED Provider Notes (Signed)
[facility]  Provider Note  CSN: [id number] Arrival date & time: 11/10/22 2130  History Chief Complaint  Patient presents with   Emesis   Diarrhea    Sandra Velasquez is a 52 y.o. female with no significant PMH reports she had one episode of vomiting on the airplane here from [place] after eating a salad before boarding. She then began having multiple episodes of diffuse watery and then bloody diarrhea and cramping abdominal pain. Low grade fever at home. No further vomiting but some nausea with the pain.    Home Medications Prior to Admission medications   Medication Sig Start Date End Date Taking? Authorizing Provider  ondansetron (ZOFRAN-ODT) 4 MG disintegrating tablet Take 1 tablet (4 mg total) by mouth every 8 (eight) hours as needed for nausea or vomiting. 11/10/22  Yes Pollyann Savoy, MD  cetirizine (ZYRTEC) 10 MG tablet Take 10 mg by mouth daily.    [provider]  Cholecalciferol (VITAMIN D) 10 MCG/ML LIQD Vitamin D    [provider]  Diethylpropion HCl CR 75 MG TB24 Take 1 tablet by mouth every morning. 12/28/20   [provider]  EPINEPHrine 0.3 mg/0.3 mL IJ SOAJ injection epinephrine 0.3 mg/0.3 mL injection, auto-injector  INJECT INTO THIGH AS INSTRUCTED AS NEEDED FOR ALLERGIC REACTION    [provider]  loratadine (CLARITIN) 10 MG tablet Take 10 mg by mouth daily.    [provider]  norgestrel-ethinyl estradiol (LOW-OGESTREL) 0.3-30 MG-MCG tablet 1 tablet    [provider]  propranolol (INDERAL) 10 MG tablet Take 1 tablet (10 mg total) by mouth 4 (four) times daily as needed (palpitations). 01/02/21   Swinyer, Zachary George, NP  tirzepatide [facility]) 5 MG/0.5ML Pen Inject 12.5 mg into the skin once a week.    [provider]  vitamin B-12 (CYANOCOBALAMIN) 100 MCG tablet See admin instructions.    [provider]     Allergies    Clarithromycin,  Cyproheptadine, Amoxicillin-pot clavulanate, Erythromycin, and Penicillin g   Review of Systems   Review of Systems Please see HPI for pertinent positives and negatives  Physical Exam BP (!) 110/94   Pulse 90   Temp 98.5 F (36.9 C)   Resp 20   Ht 5\' 5"  (1.651 m) Comment: Simultaneous filing. User may not have seen previous data.  Wt 70.3 kg Comment: Simultaneous filing. User may not have seen previous data.  LMP 10/29/2022 (Exact Date)   SpO2 100%   BMI 25.79 kg/m   Physical Exam Vitals and nursing note reviewed.  Constitutional:      Appearance: Normal appearance.  HENT:     Head: Normocephalic and atraumatic.     Nose: Nose normal.     Mouth/Throat:     Mouth: Mucous membranes are moist.  Eyes:     Extraocular Movements: Extraocular movements intact.     Conjunctiva/sclera: Conjunctivae normal.  Cardiovascular:     Rate and Rhythm: Normal rate.  Pulmonary:     Effort: Pulmonary effort is normal.     Breath sounds: Normal breath sounds.  Abdominal:     General: Abdomen is flat.     Palpations: Abdomen is soft.     Tenderness: There is abdominal tenderness (diffuse). There is no guarding.  Musculoskeletal:        General: No swelling. Normal range of motion.     Cervical back: Neck supple.  Skin:    General: Skin is warm and dry.  Neurological:     General: No focal deficit present.     Mental Status: She is alert.  Psychiatric:        Mood and Affect: Mood normal.     ED Results / Procedures / Treatments   EKG None  Procedures Procedures  Medications Ordered in the ED Medications  ondansetron (ZOFRAN) injection 4 mg (4 mg Intravenous Given 11/10/22 0301)  sodium chloride 0.9 % bolus 1,000 mL (0 mLs Intravenous Stopped 11/10/22 0438)  iohexol (OMNIPAQUE) 300 MG/ML solution 100 mL (100 mLs Intravenous Contrast Given 11/10/22 0352)    Initial Impression and Plan  Patient here with several hours of cramping abdominal pain and bloody diarrhea. Travel  was to [facility], no international/tropical travel. Concern for EHEC. Will check labs, including stool studies, send for CT. IVF, antiemetics for comfort.    ED Course   Clinical Course as of 11/10/22 0438  Sun Nov 10, 2022  6433 CBC with mild leukocytosis.  [CS]  0328 CMP is unremarkable.  [CS]  [id number] UA is clear [CS]  0435 I personally viewed the images from radiology studies and agree with radiologist interpretation: CT shows colitis, consistent with her symptoms. She is feeling better, has not had a bowel movement since arrival. Discussed empiric treatment for colitis, but given concern for EHEC and risk of HUS, will avoid antibiotics for now. She is comfortable going home. Recommend supportive care, Rx for Zofran. Will give supplies to collect stool specimen at home if symptoms worsen; per lab, she will need to take it to PCP if so.  [CS]    Clinical Course User Index [CS] Pollyann Savoy, MD     MDM Rules/Calculators/A&P Medical Decision Making Given presenting complaint, I considered that admission might be necessary. After review of results from ED lab and/or imaging studies, admission to the hospital is not indicated at this time.    Problems Addressed: Bloody diarrhea: acute illness or injury  Amount and/or Complexity of Data Reviewed Labs: ordered. Decision-making details documented in ED Course. Radiology: ordered and independent interpretation performed. Decision-making details documented in ED Course.  Risk Prescription drug management. Decision regarding hospitalization.     Final Clinical Impression(s) / ED Diagnoses Final diagnoses:  Bloody diarrhea    Rx / DC Orders ED Discharge Orders          Ordered    ondansetron (ZOFRAN-ODT) 4 MG disintegrating tablet  Every 8 hours PRN        11/10/22 0438             Pollyann Savoy, MD 11/10/22 [phone]

## 2022-11-10 NOTE — ED Triage Notes (Signed)
Pt POV from home reporting multiple episodes of emesis and diarrhea since yesterday afternoon after having salad at airport. Suspected food poisoning, able to tolerate liquids at this time

## 2022-11-29 ENCOUNTER — Ambulatory Visit (HOSPITAL_COMMUNITY): Payer: Managed Care, Other (non HMO) | Attending: Internal Medicine

## 2022-11-29 DIAGNOSIS — I77819 Aortic ectasia, unspecified site: Secondary | ICD-10-CM | POA: Diagnosis present

## 2022-11-29 DIAGNOSIS — R002 Palpitations: Secondary | ICD-10-CM | POA: Diagnosis not present

## 2022-11-29 LAB — ECHOCARDIOGRAM COMPLETE
Area-P 1/2: 3.48 cm2
S' Lateral: 2.8 cm

## 2022-11-29 MED ORDER — PERFLUTREN LIPID MICROSPHERE
1.0000 mL | INTRAVENOUS | Status: AC | PRN
  Administered 2022-11-29: 2 mL via INTRAVENOUS

## 2023-02-20 ENCOUNTER — Ambulatory Visit: Payer: Managed Care, Other (non HMO) | Attending: Internal Medicine | Admitting: Internal Medicine

## 2023-02-20 ENCOUNTER — Encounter: Payer: Self-pay | Admitting: Internal Medicine

## 2023-02-20 VITALS — BP 90/60 | HR 78 | Ht 65.0 in | Wt 157.6 lb

## 2023-02-20 DIAGNOSIS — R002 Palpitations: Secondary | ICD-10-CM | POA: Diagnosis not present

## 2023-02-20 DIAGNOSIS — E785 Hyperlipidemia, unspecified: Secondary | ICD-10-CM

## 2023-02-20 DIAGNOSIS — I77819 Aortic ectasia, unspecified site: Secondary | ICD-10-CM | POA: Diagnosis not present

## 2023-02-20 DIAGNOSIS — E861 Hypovolemia: Secondary | ICD-10-CM | POA: Diagnosis not present

## 2023-02-20 DIAGNOSIS — R55 Syncope and collapse: Secondary | ICD-10-CM

## 2023-02-20 DIAGNOSIS — I440 Atrioventricular block, first degree: Secondary | ICD-10-CM

## 2023-02-20 NOTE — Progress Notes (Signed)
 Cardiology Office Note:  .   Date:  02/20/2023  ID:  Sandra Velasquez, DOB [birth year], MRN [id number] PCP: [facility] Providers Cardiologist:  Soyla DELENA Merck, MD    History of Present Illness: .   Sandra Velasquez is a 53 y.o. female.  Discussed the use of AI scribe software for clinical note transcription with the patient, who gave verbal consent to proceed.  History of Present Illness   Sandra Velasquez is a 53 year old female who presents for a cardiology follow-up regarding her weight loss medication and hair loss concerns. She was referred by [facility] for cardiology evaluation.  She is currently taking Mounjaro for weight loss, which she started over a year ago. She has lost 43 pounds, primarily in the first five months of treatment, and has been maintaining her weight since. She is considering continued use the medication due to concerns about potential side effects, including hair loss.   She experiences significant hair loss, describing it as 'losing a ton of hair' and 'half my hair,' which she attributes to either menopause or nutritional deficiencies due to decreased appetite from the medication. She takes biotin gummies nightly to help with hair health. She has a history of anaphylaxis four years ago following a perm, which resulted in hair damage. Her hair has not returned to normal since then, and she describes her hairline as 'splotchy.' She is focusing on improving her nutrition, particularly increasing protein intake.  She has a history of palpitations, which have decreased since discontinuing an appetite suppressant. She occasionally experiences palpitations, particularly after consuming caffeine. She is no longer taking propranolol  and is currently on hormone therapy, Zyrtec, Claritin, and Valtrex for cold sores. No current palpitations are reported.  She experiences low blood pressure and occasional dizziness, particularly  during physical activities like [employer] classes. She attributes this to inadequate hydration and electrolyte intake, noting improvement with increased water and electrolyte consumption. She is not on any blood pressure medication.  Her family history includes breast cancer in her grandmother and lymphoma in her father, which she believes was related to his rheumatoid arthritis medication. Her recent mammograms and genetic testing have been normal.        ROS: negative except per HPI above.  Studies Reviewed: SABRA   EKG Interpretation Date/Time:  Thursday February 20 2023 08:34:16 EST Ventricular Rate:  78 PR Interval:  220 QRS Duration:  90 QT Interval:  384 QTC Calculation: 437 R Axis:   -12  Text Interpretation: Sinus rhythm with 1st degree A-V block Low voltage QRS Confirmed by Merck Soyla ([id number]) on 02/20/2023 9:12:07 AM    Results   LABS Thyroid labs: within normal limits (11/2022) Cholesterol: within normal limits (11/2022)  RADIOLOGY Coronary CT: normal (10/2020)  DIAGNOSTIC Echocardiogram: borderline aorta size, no significant change (11/2022) EKG: first-degree AV block, borderline (02/20/2023)     Risk Assessment/Calculations:        Physical Exam:   VS:  BP 90/60 (BP Location: Left Arm, Patient Position: Sitting)   Pulse 78   Ht 5' 5 (1.651 m)   Wt 157 lb 9.6 oz (71.5 kg)   SpO2 98%   BMI 26.23 kg/m    Wt Readings from Last 3 Encounters:  02/20/23 157 lb 9.6 oz (71.5 kg)  11/10/22 155 lb (70.3 kg)  11/28/21 183 lb (83 kg)     Physical Exam   VITALS: P- 81 CHEST: Lungs clear to auscultation.  GEN: Well nourished, well developed in no acute distress NECK: No JVD; No carotid bruits CARDIAC: RRR, no murmurs, rubs, gallops RESPIRATORY:  Clear to auscultation without rales, wheezing or rhonchi  ABDOMEN: Soft, non-tender, non-distended EXTREMITIES:  No edema; No deformity   ASSESSMENT AND PLAN: .    Assessment & Plan Dyslipidemia, goal LDL below  70  Dilation of aorta (HCC)  Palpitations  Hypotension due to hypovolemia  Near syncope  First degree AV block   Assessment and Plan    Weight Loss on Mounjaro, Cardiac risk counseling -Continue Mounjaro as tolerated. -Focus on maintaining a balanced diet with adequate protein intake.  First Degree AV Block Palpitations - minimal recurrence of palpitations EKG shows a borderline first degree AV block. No symptoms of syncope or unexplained dizziness. -Continue to monitor with annual EKGs. -If symptoms of syncope or unexplained dizziness occur, patient to contact office.  Low Blood Pressure Patient reports near syncope during exercise. -Increase hydration and electrolyte intake. -Consider use of compression garments during exercise.  Aortic Size Aortic size is borderline but stable on recent echocardiogram. -Plan for repeat echocardiogram in one year. -Consider repeat CT scan in 3-5 years.  Follow-up Annual visit in one year, or sooner if any new symptoms arise.

## 2023-02-20 NOTE — Patient Instructions (Addendum)
 Medication Instructions:  Your physician recommends that you continue on your current medications as directed. Please refer to the Current Medication list given to you today.  *If you need a refill on your cardiac medications before your next appointment, please call your pharmacy*  Lab Work: None  Testing/Procedures: Your physician has requested that you have an echocardiogram in about one year, before seeing Dr. Acharya. Echocardiography is a painless test that uses sound waves to create images of your heart. It provides your doctor with information about the size and shape of your heart and how well your heart's chambers and valves are working. This procedure takes approximately one hour. There are no restrictions for this procedure. Please do NOT wear cologne, perfume, aftershave, or lotions (deodorant is allowed). Please arrive 15 minutes prior to your appointment time. This will take place at [address]    Follow-Up: At [facility], you and your health needs are our priority.  As part of our continuing mission to provide you with exceptional heart care, we have created designated Provider Care Teams.  These Care Teams include your primary Cardiologist (physician) and Advanced Practice Providers (APPs -  Physician Assistants and Nurse Practitioners) who all work together to provide you with the care you need, when you need it.  Your next appointment:   9-12 month(s) [Can be right after Echo (11/13/23 or 11/14/23) if patient prefers]  Provider:   Gayatri A Acharya, MD

## 2023-11-11 ENCOUNTER — Ambulatory Visit (HOSPITAL_COMMUNITY)
Admission: RE | Admit: 2023-11-11 | Discharge: 2023-11-11 | Disposition: A | Discharge status: Home / Self Care | Payer: Managed Care, Other (non HMO) | Source: Ambulatory Visit | Attending: Cardiology | Admitting: Cardiology

## 2023-11-11 DIAGNOSIS — R002 Palpitations: Secondary | ICD-10-CM | POA: Insufficient documentation

## 2023-11-11 DIAGNOSIS — I77812 Thoracoabdominal aortic ectasia: Secondary | ICD-10-CM | POA: Diagnosis not present

## 2023-11-11 DIAGNOSIS — I77819 Aortic ectasia, unspecified site: Secondary | ICD-10-CM | POA: Diagnosis not present

## 2023-11-11 DIAGNOSIS — E785 Hyperlipidemia, unspecified: Secondary | ICD-10-CM | POA: Insufficient documentation

## 2023-11-11 LAB — ECHOCARDIOGRAM COMPLETE
Area-P 1/2: 3.42 cm2
S' Lateral: 2.7 cm

## 2023-11-11 MED ORDER — PERFLUTREN LIPID MICROSPHERE
1.0000 mL | INTRAVENOUS | Status: AC | PRN
  Administered 2023-11-11: 1 mL via INTRAVENOUS

## 2023-11-25 ENCOUNTER — Ambulatory Visit: Payer: Self-pay | Admitting: Internal Medicine

## 2023-12-19 ENCOUNTER — Encounter: Payer: Self-pay | Admitting: *Deleted

## 2024-01-26 ENCOUNTER — Encounter: Payer: Self-pay | Admitting: Internal Medicine

## 2024-01-26 ENCOUNTER — Ambulatory Visit: Attending: Internal Medicine | Admitting: Internal Medicine

## 2024-01-26 VITALS — BP 95/63 | HR 83 | Ht 65.0 in | Wt 166.1 lb

## 2024-01-26 DIAGNOSIS — I440 Atrioventricular block, first degree: Secondary | ICD-10-CM | POA: Diagnosis not present

## 2024-01-26 DIAGNOSIS — E785 Hyperlipidemia, unspecified: Secondary | ICD-10-CM | POA: Diagnosis not present

## 2024-01-26 DIAGNOSIS — R002 Palpitations: Secondary | ICD-10-CM

## 2024-01-26 NOTE — Progress Notes (Signed)
 " Cardiology Office Note:  .   Date:  01/26/2024  ID:  Sandra Velasquez, DOB June 27, 1970, MRN [id number] PCP: [facility] Providers Cardiologist:  Soyla DELENA Merck, MD    History of Present Illness: .   Sandra Velasquez is a 54 y.o. female.  Discussed the use of AI scribe software for clinical note transcription with the patient, who gave verbal consent to proceed.  History of Present Illness Sandra Velasquez is a 54 year old female who presents for a cardiovascular follow-up and medication review.  She is taking estradiol 1 mg-norethindrone 0.5 mg for menopause. She notes menstrual changes with the higher estradiol dose but no cardiovascular symptoms.  She is on Mounjaro 10 mg for weight management without reported cardiovascular side effects. She also used phentermine as an appetite suppressant. She previously had heart palpitations and leg cramps with other weight-loss medications.  She has muscle strains and a prior tendon tear that limit activity, and she plans low-impact exercise such as aqua exercise and walking, with no chest pain, dyspnea, presyncope, or other limiting cardiovascular symptoms reported.  She has a known borderline first-degree heart block on prior EKG. She has had no fainting, dizziness, or lightheadedness since the last visit. She is off propranolol . Her blood pressure runs low for her baseline without associated symptoms.    ROS: negative except per HPI above.  Studies Reviewed: SABRA   EKG Interpretation Date/Time:  Monday January 26 2024 09:19:52 EST Ventricular Rate:  73 PR Interval:  196 QRS Duration:  86 QT Interval:  384 QTC Calculation: 423 R Axis:   18  Text Interpretation: Normal sinus rhythm Normal ECG When compared with ECG of 20-Feb-2023 08:34, first degree avb no longer seen Confirmed by Perina Salvaggio ([id number]) on 01/26/2024 9:21:55 AM    Results Diagnostic EKG (01/2023): First degree atrioventricular  block, PR interval 220 ms, borderline abnormal (Independently interpreted) Echocardiogram (Fall 2025): Stable findings, aortic dimensions stable, no follow-up echocardiogram required Risk Assessment/Calculations:       Physical Exam:   VS:  BP 95/63 (BP Location: Left Arm, Patient Position: Sitting)   Pulse 83   Ht 5' 5 (1.651 m)   Wt 166 lb 1.6 oz (75.3 kg)   SpO2 98%   BMI 27.64 kg/m    Wt Readings from Last 3 Encounters:  01/26/24 166 lb 1.6 oz (75.3 kg)  02/20/23 157 lb 9.6 oz (71.5 kg)  11/10/22 155 lb (70.3 kg)     Physical Exam GENERAL: Alert, cooperative, well developed, no acute distress HEENT: Normocephalic, normal oropharynx, moist mucous membranes CHEST: Clear to auscultation bilaterally, no wheezes, rhonchi, or crackles CARDIOVASCULAR: Normal heart rate and rhythm, S1 and S2 normal without murmurs ABDOMEN: Soft, non-tender, non-distended, without organomegaly, normal bowel sounds EXTREMITIES: No cyanosis or edema NEUROLOGICAL: Cranial nerves grossly intact, moves all extremities without gross motor or sensory deficit   ASSESSMENT AND PLAN: .    Assessment and Plan Assessment & Plan First degree atrioventricular block First degree AV block with PR interval at 220 ms last EKG. Current EKG normal. Asymptomatic, no immediate concerns. - Scheduled follow-up in one year for EKG reassessment and cardiovascular status.  Weight Loss on Mounjaro, Cardiac risk counseling -Continue Mounjaro as tolerated. -Focus on maintaining a balanced diet with adequate protein intake.   Palpitations - minimal recurrence of palpitations - No symptoms of syncope or unexplained dizziness. -Continue to monitor with annual EKGs. -If symptoms of syncope or unexplained dizziness occur, patient  to contact office. - off propranolol  currently.   Low Blood Pressure - overall asymptomatic -Increase hydration and electrolyte intake. -Consider use of compression garments during exercise.    Aortic Size Aortic size is borderline but stable on recent echocardiogram. -Plan for repeat echocardiogram in 1-2 years.   Follow-up Annual visit in one year, or sooner if any new symptoms arise.        Soyla Merck, MD, [facility] "

## 2024-01-26 NOTE — Patient Instructions (Signed)
 Medication Instructions:  No Changes *If you need a refill on your cardiac medications before your next appointment, please call your pharmacy*  Lab Work: None  Follow-Up: At [facility], you and your health needs are our priority.  As part of our continuing mission to provide you with exceptional heart care, our providers are all part of one team.  This team includes your primary Cardiologist (physician) and Advanced Practice Providers or APPs (Physician Assistants and Nurse Practitioners) who all work together to provide you with the care you need, when you need it.  Your next appointment:   1 year(s)  Provider:   Gayatri A Acharya, MD
